# Patient Record
Sex: Female | Born: 1970 | Race: White | Hispanic: No | Smoking: Current every day smoker
Health system: Southern US, Community
[De-identification: ages and names within clinical notes are randomized; demographics above are authoritative.]

## PROBLEM LIST (undated history)

## (undated) DIAGNOSIS — R569 Unspecified convulsions: Secondary | ICD-10-CM

## (undated) DIAGNOSIS — B1920 Unspecified viral hepatitis C without hepatic coma: Secondary | ICD-10-CM

## (undated) HISTORY — PX: TONSILLECTOMY: SUR1361

## (undated) HISTORY — PX: ABDOMINAL SURGERY: SHX537

## (undated) HISTORY — PX: OVARIAN CYST SURGERY: SHX726

## (undated) HISTORY — PX: OOPHORECTOMY: SHX86

## (undated) HISTORY — PX: TUBAL LIGATION: SHX77

## (undated) HISTORY — PX: CHOLECYSTECTOMY: SHX55

---

## 2000-01-04 ENCOUNTER — Encounter: Payer: Self-pay | Admitting: Emergency Medicine

## 2000-01-04 ENCOUNTER — Emergency Department (HOSPITAL_COMMUNITY): Admission: EM | Admit: 2000-01-04 | Discharge: 2000-01-04 | Payer: Self-pay | Admitting: Emergency Medicine

## 2000-02-12 ENCOUNTER — Emergency Department (HOSPITAL_COMMUNITY): Admission: EM | Admit: 2000-02-12 | Discharge: 2000-02-13 | Payer: Self-pay | Admitting: Emergency Medicine

## 2000-07-17 ENCOUNTER — Encounter: Payer: Self-pay | Admitting: Internal Medicine

## 2000-07-17 ENCOUNTER — Inpatient Hospital Stay (HOSPITAL_COMMUNITY): Admission: EM | Admit: 2000-07-17 | Discharge: 2000-07-19 | Payer: Self-pay

## 2003-03-24 ENCOUNTER — Emergency Department (HOSPITAL_COMMUNITY): Admission: EM | Admit: 2003-03-24 | Discharge: 2003-03-25 | Payer: Self-pay | Admitting: Emergency Medicine

## 2003-10-02 ENCOUNTER — Other Ambulatory Visit: Payer: Self-pay

## 2003-11-09 ENCOUNTER — Other Ambulatory Visit: Payer: Self-pay

## 2004-01-05 ENCOUNTER — Inpatient Hospital Stay: Payer: Self-pay

## 2004-04-11 ENCOUNTER — Emergency Department: Payer: Self-pay | Admitting: Unknown Physician Specialty

## 2004-04-12 ENCOUNTER — Emergency Department: Payer: Self-pay | Admitting: Emergency Medicine

## 2004-05-13 ENCOUNTER — Emergency Department: Payer: Self-pay | Admitting: Emergency Medicine

## 2004-06-24 ENCOUNTER — Emergency Department: Payer: Self-pay | Admitting: Emergency Medicine

## 2004-07-06 ENCOUNTER — Inpatient Hospital Stay: Payer: Self-pay | Admitting: Psychiatry

## 2004-07-16 ENCOUNTER — Emergency Department: Payer: Self-pay | Admitting: Internal Medicine

## 2004-07-24 ENCOUNTER — Emergency Department: Payer: Self-pay | Admitting: Emergency Medicine

## 2004-07-25 ENCOUNTER — Emergency Department: Payer: Self-pay | Admitting: Emergency Medicine

## 2004-07-27 ENCOUNTER — Emergency Department: Payer: Self-pay | Admitting: Internal Medicine

## 2004-09-03 ENCOUNTER — Emergency Department: Payer: Self-pay | Admitting: Emergency Medicine

## 2004-10-08 ENCOUNTER — Other Ambulatory Visit: Payer: Self-pay

## 2004-10-08 ENCOUNTER — Inpatient Hospital Stay: Payer: Self-pay | Admitting: Unknown Physician Specialty

## 2004-12-05 ENCOUNTER — Emergency Department: Payer: Self-pay | Admitting: Emergency Medicine

## 2004-12-10 ENCOUNTER — Emergency Department: Payer: Self-pay | Admitting: Internal Medicine

## 2005-03-06 ENCOUNTER — Inpatient Hospital Stay: Payer: Self-pay | Admitting: Internal Medicine

## 2005-06-05 ENCOUNTER — Emergency Department: Payer: Self-pay | Admitting: Emergency Medicine

## 2005-07-31 ENCOUNTER — Inpatient Hospital Stay: Payer: Self-pay | Admitting: Psychiatry

## 2005-08-06 ENCOUNTER — Other Ambulatory Visit: Payer: Self-pay

## 2005-09-02 ENCOUNTER — Emergency Department (HOSPITAL_COMMUNITY): Admission: EM | Admit: 2005-09-02 | Discharge: 2005-09-02 | Payer: Self-pay | Admitting: Family Medicine

## 2005-09-20 ENCOUNTER — Emergency Department: Payer: Self-pay | Admitting: Emergency Medicine

## 2005-11-27 ENCOUNTER — Emergency Department: Payer: Self-pay | Admitting: Emergency Medicine

## 2006-03-17 ENCOUNTER — Emergency Department: Payer: Self-pay | Admitting: Emergency Medicine

## 2006-03-20 ENCOUNTER — Emergency Department: Payer: Self-pay | Admitting: General Practice

## 2006-07-19 IMAGING — CT CT ABD-PELV W/O CM
1 of 2 series · 15 of 32 positions shown, 19 images · non-contrast
Comparison: none

REASON FOR EXAM: (1) abd pain / stone protocol / [HOSPITAL]; (2) abd pain /
stone protocol / [HOSPITAL]
COMMENTS:

[Series 2: stone · axial · 0.66mm/px · z∈[-516,-148]mm · 15 of 140 slices shown, 19 images]
[im 11/140  soft-tissue]
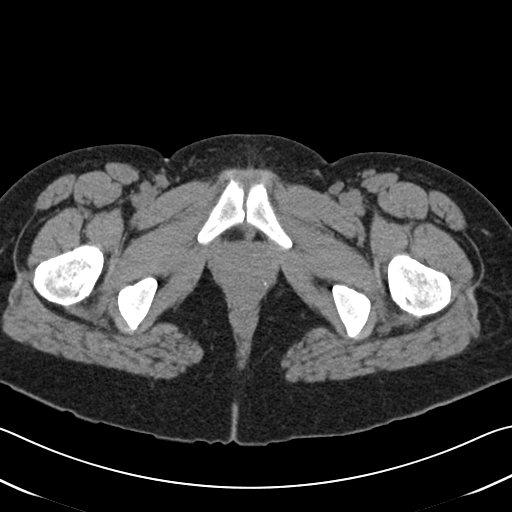
[im 11/140  bone]
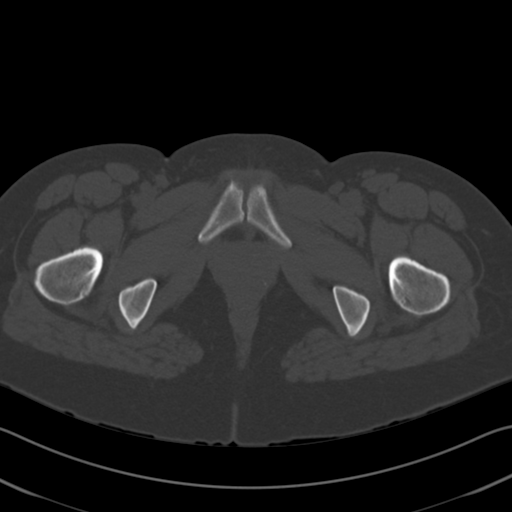
[im 21/140  soft-tissue]
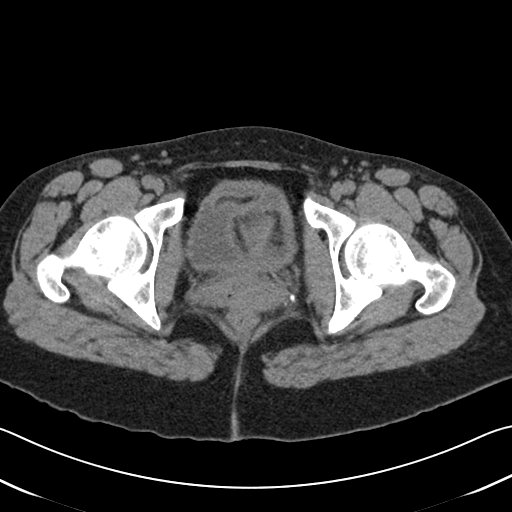
[im 31/140  soft-tissue]
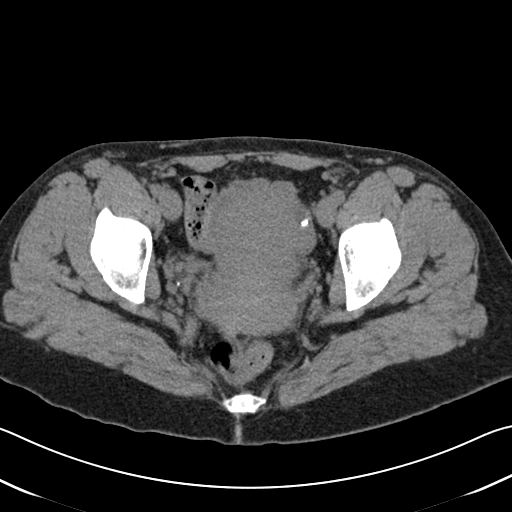
[im 42/140  soft-tissue]
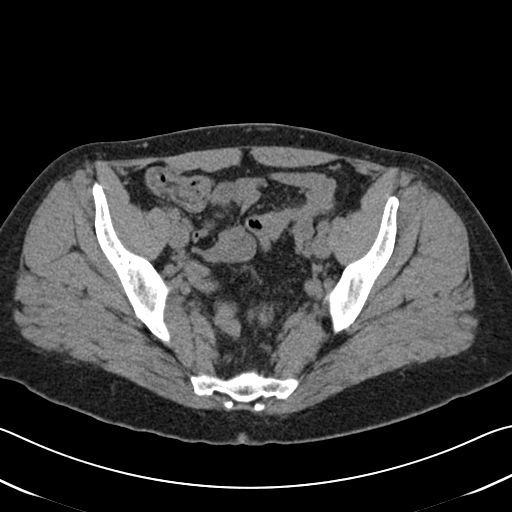
[im 52/140  soft-tissue]
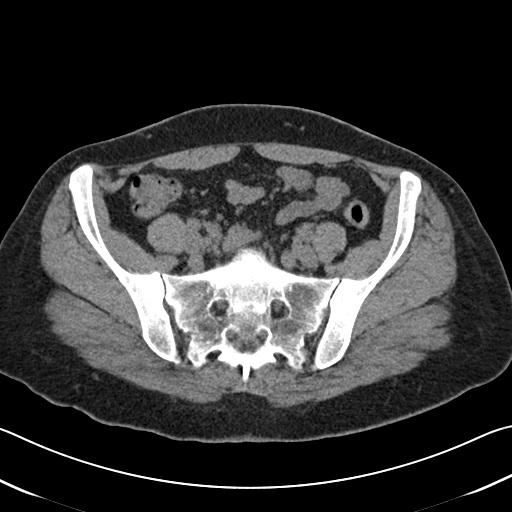
[im 62/140  soft-tissue]
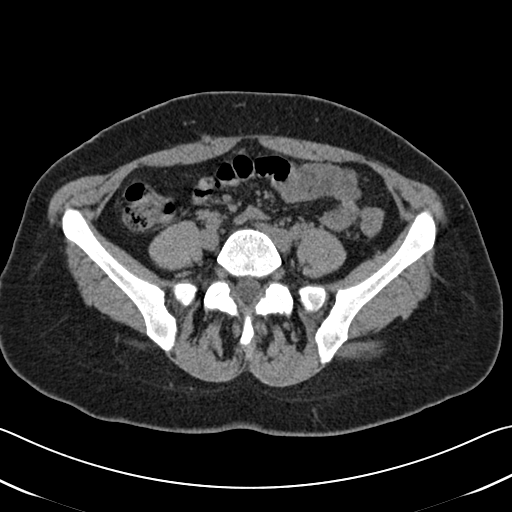
[im 73/140  soft-tissue]
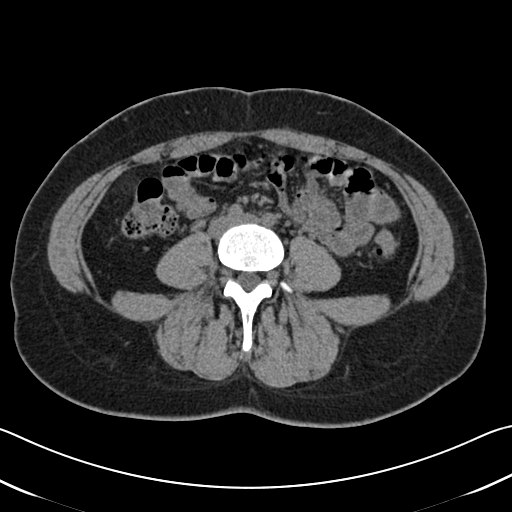
[im 83/140  soft-tissue]
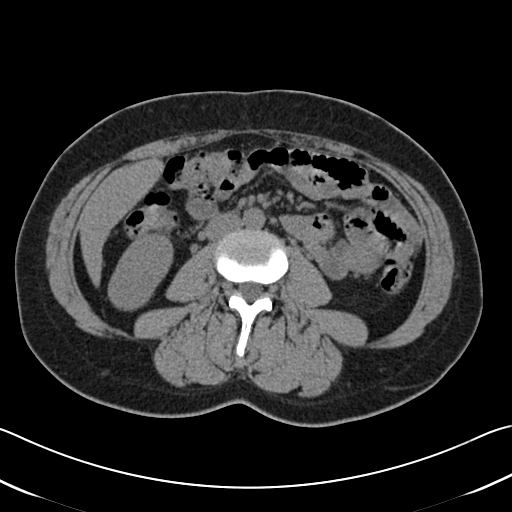
[im 93/140  soft-tissue]
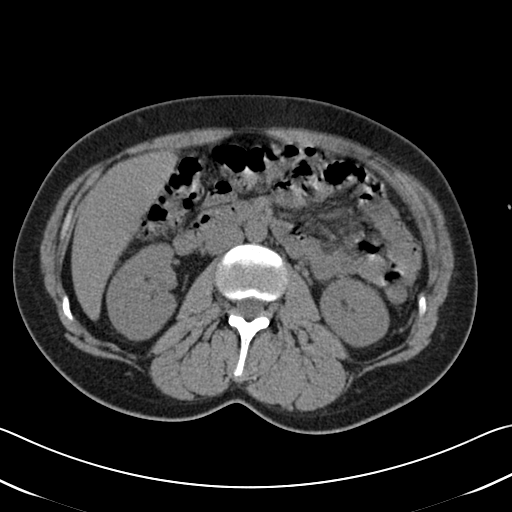
[im 93/140  bone]
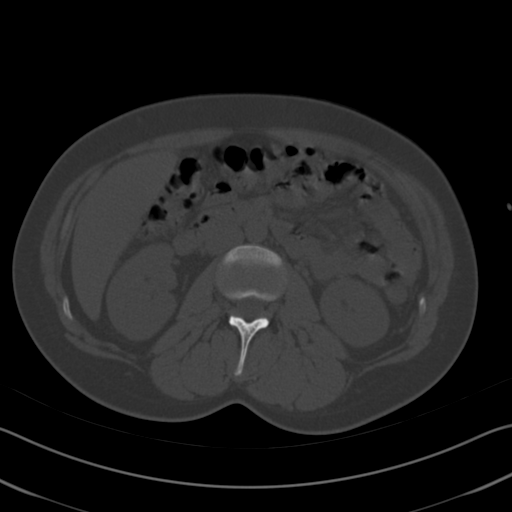
[im 103/140  soft-tissue]
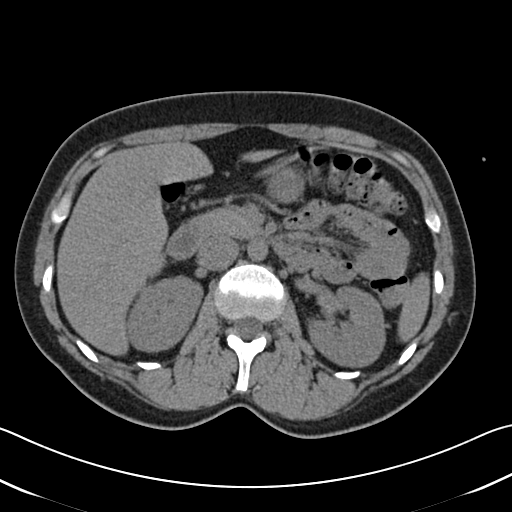
[im 114/140  soft-tissue]
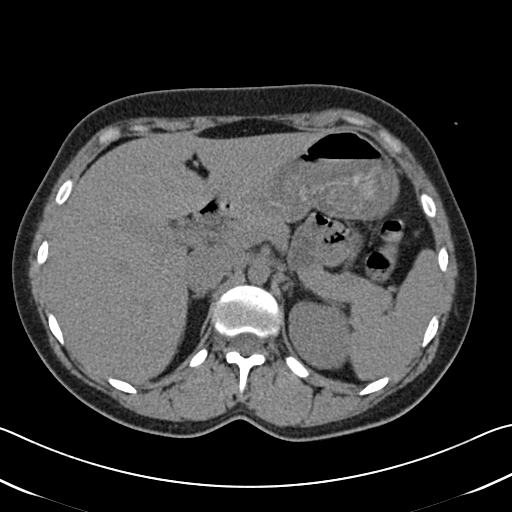
[im 119/140  lung]
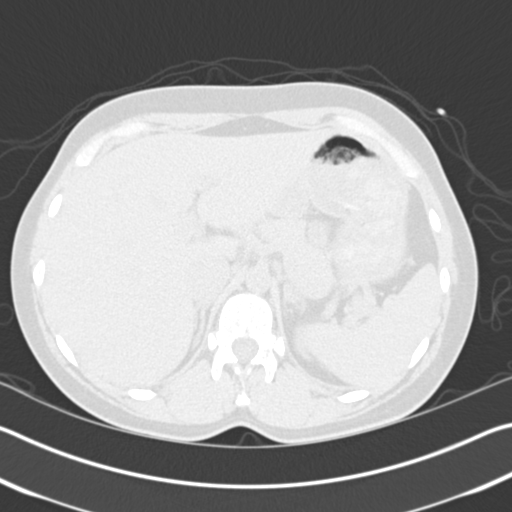
[im 124/140  soft-tissue]
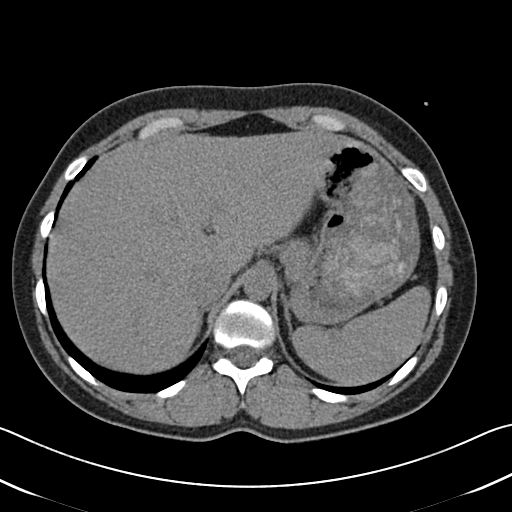
[im 124/140  lung]
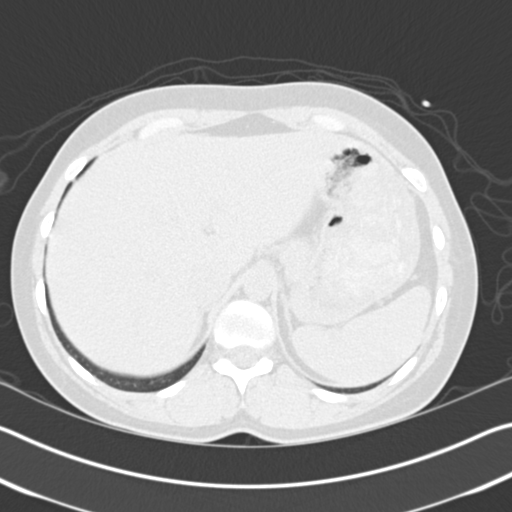
[im 129/140  lung]
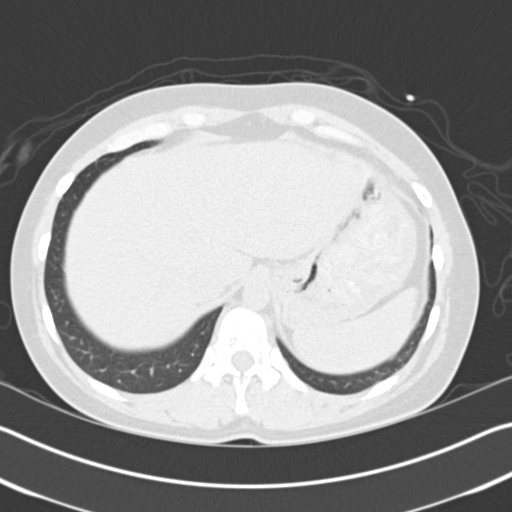
[im 134/140  soft-tissue]
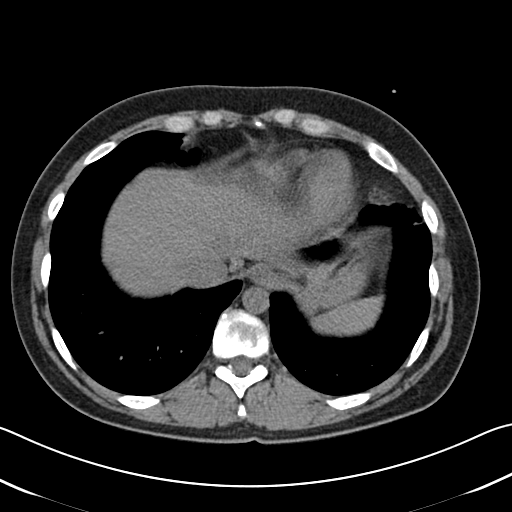
[im 134/140  lung]
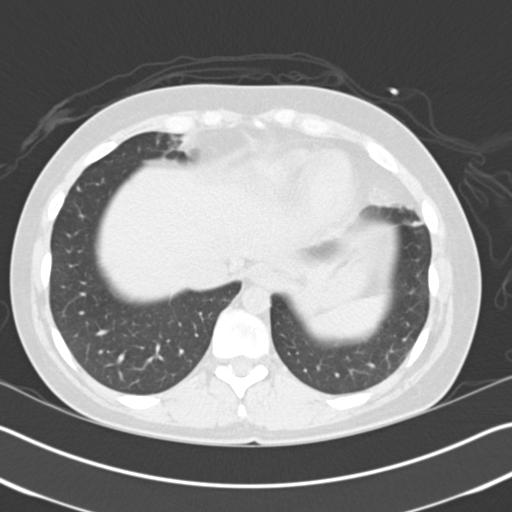

[15 of 32 positions shown; findings below may reference images not displayed]

PROCEDURE:     CT  - CT ABDOMEN AND PELVIS W[DATE] [DATE]

RESULT:       The liver and spleen are normal.  The pancreas is normal.
Surgical clips are noted in the gallbladder fossa.  There is no biliary
distention.  The adrenals are normal.  There is no hydronephrosis. Multiple
calcified pelvic densities are noted.  These are most consistent with
phleboliths.  Tiny sand-like distal ureteral stones that are nonobstructing
cannot be excluded. The abdominal aorta is normal.  There is no bowel
distention.  The uterus is enlarged.  A calcified uterine fibroid may be
present.  This could be further evaluated with pelvic ultrasound as
clinically indicated.
IMPRESSION: 1.     Uterine enlargement with possible calcified uterine fibroid.
2.     Multiple calcified pelvic densities most consistent with phleboliths.
 Nonobstructing distal ureteral stones cannot be excluded.
3.     The pericecal and periappendiceal region is unremarkable.

## 2006-08-15 ENCOUNTER — Inpatient Hospital Stay (HOSPITAL_COMMUNITY): Admission: AD | Admit: 2006-08-15 | Discharge: 2006-08-16 | Payer: Self-pay | Admitting: Obstetrics & Gynecology

## 2007-10-21 ENCOUNTER — Inpatient Hospital Stay (HOSPITAL_COMMUNITY): Admission: AD | Admit: 2007-10-21 | Discharge: 2007-10-26 | Payer: Self-pay | Admitting: Psychiatry

## 2007-10-21 ENCOUNTER — Ambulatory Visit: Payer: Self-pay | Admitting: Psychiatry

## 2007-10-24 ENCOUNTER — Encounter: Payer: Self-pay | Admitting: Psychiatry

## 2007-12-12 ENCOUNTER — Other Ambulatory Visit: Payer: Self-pay

## 2007-12-12 ENCOUNTER — Emergency Department: Payer: Self-pay | Admitting: Emergency Medicine

## 2007-12-20 ENCOUNTER — Emergency Department: Payer: Self-pay | Admitting: Emergency Medicine

## 2008-07-18 ENCOUNTER — Emergency Department (HOSPITAL_COMMUNITY): Admission: EM | Admit: 2008-07-18 | Discharge: 2008-07-18 | Payer: Self-pay | Admitting: Emergency Medicine

## 2008-07-30 ENCOUNTER — Inpatient Hospital Stay (HOSPITAL_COMMUNITY): Admission: AD | Admit: 2008-07-30 | Discharge: 2008-08-06 | Payer: Self-pay | Admitting: *Deleted

## 2008-07-30 ENCOUNTER — Ambulatory Visit: Payer: Self-pay | Admitting: *Deleted

## 2008-07-30 ENCOUNTER — Emergency Department (HOSPITAL_COMMUNITY): Admission: EM | Admit: 2008-07-30 | Discharge: 2008-07-30 | Payer: Self-pay | Admitting: Emergency Medicine

## 2008-08-09 ENCOUNTER — Emergency Department: Payer: Self-pay | Admitting: Emergency Medicine

## 2008-08-10 ENCOUNTER — Emergency Department: Payer: Self-pay | Admitting: Internal Medicine

## 2008-08-15 ENCOUNTER — Emergency Department: Payer: Self-pay | Admitting: Internal Medicine

## 2008-08-15 ENCOUNTER — Emergency Department: Payer: Self-pay | Admitting: Emergency Medicine

## 2008-08-16 ENCOUNTER — Emergency Department: Payer: Self-pay | Admitting: Emergency Medicine

## 2009-04-16 ENCOUNTER — Emergency Department (HOSPITAL_COMMUNITY): Admission: EM | Admit: 2009-04-16 | Discharge: 2009-04-16 | Payer: Self-pay | Admitting: Emergency Medicine

## 2009-05-24 ENCOUNTER — Emergency Department: Payer: Self-pay | Admitting: Emergency Medicine

## 2009-05-30 ENCOUNTER — Emergency Department: Payer: Self-pay | Admitting: Internal Medicine

## 2009-06-12 ENCOUNTER — Emergency Department: Payer: Self-pay | Admitting: Emergency Medicine

## 2009-08-23 ENCOUNTER — Ambulatory Visit: Payer: Self-pay | Admitting: Pain Medicine

## 2010-03-13 ENCOUNTER — Emergency Department: Payer: Self-pay | Admitting: Emergency Medicine

## 2010-03-26 ENCOUNTER — Ambulatory Visit: Payer: Medicare Other | Admitting: Internal Medicine

## 2010-04-08 ENCOUNTER — Emergency Department: Payer: Self-pay | Admitting: Emergency Medicine

## 2010-06-11 LAB — POCT PREGNANCY, URINE: Preg Test, Ur: NEGATIVE

## 2010-06-11 LAB — POCT I-STAT, CHEM 8
BUN: 9 mg/dL (ref 6–23)
Calcium, Ion: 1.11 mmol/L — ABNORMAL LOW (ref 1.12–1.32)
Chloride: 108 mEq/L (ref 96–112)
Creatinine, Ser: 0.7 mg/dL (ref 0.4–1.2)
Glucose, Bld: 99 mg/dL (ref 70–99)
HCT: 40 % (ref 36.0–46.0)
Hemoglobin: 13.6 g/dL (ref 12.0–15.0)
Potassium: 3.5 mEq/L (ref 3.5–5.1)
Sodium: 138 mEq/L (ref 135–145)
TCO2: 21 mmol/L (ref 0–100)

## 2010-06-11 LAB — DIFFERENTIAL
Basophils Absolute: 0.1 10*3/uL (ref 0.0–0.1)
Basophils Relative: 1 % (ref 0–1)
Eosinophils Absolute: 0.2 10*3/uL (ref 0.0–0.7)
Eosinophils Relative: 3 % (ref 0–5)
Lymphocytes Relative: 29 % (ref 12–46)
Lymphs Abs: 2.6 10*3/uL (ref 0.7–4.0)
Monocytes Absolute: 0.8 10*3/uL (ref 0.1–1.0)
Monocytes Relative: 9 % (ref 3–12)
Neutro Abs: 5.4 10*3/uL (ref 1.7–7.7)
Neutrophils Relative %: 59 % (ref 43–77)

## 2010-06-11 LAB — URINALYSIS, ROUTINE W REFLEX MICROSCOPIC
Bilirubin Urine: NEGATIVE
Glucose, UA: NEGATIVE mg/dL
Ketones, ur: NEGATIVE mg/dL
Nitrite: NEGATIVE
Protein, ur: NEGATIVE mg/dL
Specific Gravity, Urine: 1.009 (ref 1.005–1.030)
Urobilinogen, UA: 1 mg/dL (ref 0.0–1.0)
pH: 6.5 (ref 5.0–8.0)

## 2010-06-11 LAB — PHENYTOIN LEVEL, TOTAL: Phenytoin Lvl: <2.5 ug/mL — ABNORMAL LOW (ref 10.0–20.0)

## 2010-06-11 LAB — URINE MICROSCOPIC-ADD ON

## 2010-06-11 LAB — VALPROIC ACID LEVEL: Valproic Acid Lvl: 24.5 ug/mL — ABNORMAL LOW (ref 50.0–100.0)

## 2010-06-11 LAB — CBC
HCT: 37.4 % (ref 36.0–46.0)
Hemoglobin: 13.3 g/dL (ref 12.0–15.0)
MCHC: 35.5 g/dL (ref 30.0–36.0)
MCV: 84.8 fL (ref 78.0–100.0)
Platelets: 243 10*3/uL (ref 150–400)
RBC: 4.41 MIL/uL (ref 3.87–5.11)
RDW: 13.9 % (ref 11.5–15.5)
WBC: 9.2 10*3/uL (ref 4.0–10.5)

## 2010-07-04 LAB — URINALYSIS, ROUTINE W REFLEX MICROSCOPIC
Bilirubin Urine: NEGATIVE
Glucose, UA: NEGATIVE mg/dL
Hgb urine dipstick: NEGATIVE
Nitrite: NEGATIVE
Protein, ur: NEGATIVE mg/dL
Specific Gravity, Urine: 1.029 (ref 1.005–1.030)
Urobilinogen, UA: 1 mg/dL (ref 0.0–1.0)
pH: 5.5 (ref 5.0–8.0)

## 2010-07-04 LAB — BASIC METABOLIC PANEL
CO2: 24 mEq/L (ref 19–32)
Calcium: 8.7 mg/dL (ref 8.4–10.5)
GFR calc Af Amer: >60 mL/min (ref >60)
Potassium: 3.7 mEq/L (ref 3.5–5.1)
Sodium: 138 mEq/L (ref 135–145)

## 2010-07-04 LAB — DIFFERENTIAL
Basophils Relative: 3 % — ABNORMAL HIGH (ref 0–1)
Lymphocytes Relative: 19 % (ref 12–46)
Monocytes Absolute: 0.9 10*3/uL (ref 0.1–1.0)
Monocytes Relative: 6 % (ref 3–12)
Neutro Abs: 10.4 10*3/uL — ABNORMAL HIGH (ref 1.7–7.7)

## 2010-07-04 LAB — CBC
HCT: 35.8 % — ABNORMAL LOW (ref 36.0–46.0)
Hemoglobin: 13.5 g/dL (ref 12.0–15.0)
MCHC: 35.4 g/dL (ref 30.0–36.0)
MCHC: 34.1 g/dL (ref 30.0–36.0)
MCV: 84.8 fL (ref 78.0–100.0)
Platelets: 267 10*3/uL (ref 150–400)
RBC: 4.57 MIL/uL (ref 3.87–5.11)
RDW: 13 % (ref 11.5–15.5)

## 2010-07-04 LAB — HEPATIC FUNCTION PANEL
ALT: 24 U/L (ref 0–35)
AST: 27 U/L (ref 0–37)
Bilirubin, Direct: <0.1 mg/dL (ref 0.0–0.3)
Bilirubin, Direct: <0.1 mg/dL (ref 0.0–0.3)
Total Bilirubin: 0.3 mg/dL (ref 0.3–1.2)
Total Bilirubin: 0.4 mg/dL (ref 0.3–1.2)

## 2010-07-04 LAB — RAPID URINE DRUG SCREEN, HOSP PERFORMED
Amphetamines: NOT DETECTED
Barbiturates: NOT DETECTED
Benzodiazepines: NOT DETECTED
Cocaine: POSITIVE — AB
Opiates: NOT DETECTED
Tetrahydrocannabinol: NOT DETECTED

## 2010-07-04 LAB — URINE MICROSCOPIC-ADD ON

## 2010-07-04 LAB — PREGNANCY, URINE: Preg Test, Ur: NEGATIVE

## 2010-07-05 LAB — ETHANOL: Alcohol, Ethyl (B): <5 mg/dL (ref 0–10)

## 2010-07-05 LAB — POCT I-STAT, CHEM 8
BUN: 9 mg/dL (ref 6–23)
Chloride: 104 mEq/L (ref 96–112)
HCT: 40 % (ref 36.0–46.0)
Sodium: 138 mEq/L (ref 135–145)
TCO2: 22 mmol/L (ref 0–100)

## 2010-07-05 LAB — RAPID URINE DRUG SCREEN, HOSP PERFORMED: Barbiturates: NOT DETECTED

## 2010-07-05 LAB — POCT PREGNANCY, URINE: Preg Test, Ur: NEGATIVE

## 2010-08-08 NOTE — H&P (Signed)
Megan Shaffer, Megan Shaffer                 ACCOUNT NO.:  [id number]   MEDICAL RECORD NO.:  [id number]          PATIENT TYPE:  IPS   LOCATION:  0305                          [facility]ACILITY:  [facility]   PHYSICIAN:  Geoffery Lyons, M.D.      DATE OF BIRTH:  1970/07/23   DATE OF ADMISSION:  07/30/2008  DATE OF DISCHARGE:                       PSYCHIATRIC ADMISSION ASSESSMENT   TIME:  1330.   IDENTIFYING INFORMATION:  A 40 year old Caucasian female.  This is a  voluntary admission.   HISTORY OF PRESENT ILLNESS:  Second [facility] admission for this 40 year old  woman who initially presented by way of the emergency room requesting  detox and reporting suicidal thoughts as she has been using  heroin  heavily for the past several months and used 10 bags within the past 48  hours prior to presentation.  Says she has been using this amount  frequently for the last several months, very heavy use, has been  injecting it into her breasts.  Also has been out of her Xanax for the  past 4 days because someone stole it and was previously taking Xanax 2  mg t.i.d.  She was taking her Depakote, but none of her other  medications including the Paxil that she was previously on for  depression.  She also presented with a typical migraine headache that  she attributes to being agitated, upset and crying heavily.  Says she is  ashamed of her heroin use and the amount that she has been using.  She  endorses suicidal thoughts with thinking about possibly overdosing.   PAST PSYCHIATRIC HISTORY:  Second [facility] admission.  She was here  previously in July 2009 for a 5 day stay and at that time was detoxed  off of alcohol.  Has a history of a persistent use of alcohol and also  previous admissions at [facility] and [facility].  Started using heroin within the past year because she had a  boyfriend who got her started using it.  Has a history of alcohol  withdrawal seizures and has also endorsed a history of  epilepsy.   SOCIAL HISTORY:  Current living situation is not clear, although she  says she does have a safe place to go when she leaves here.  Has one 75-  year-old son and possibly other children.  Says that her children are  currently living with her ex-husband.  She denies current legal  problems.   MEDICAL HISTORY:  Primary care [facility] Rogstad is not clear.  Medical problems  are seizure disorder NOS, hepatitis C, current severe headache NOS.   CURRENT MEDICATIONS:  1. Paxil, not taking.  2. Alprazolam 2 mg t.i.d., none in the past 2-3 days.  3. Depakote 1000 mg daily.   DRUG ALLERGIES:  IMITREX, ZOFRAN, TORADOL which have caused angioedema  edema and CIPRO.   PHYSICAL EXAMINATION:  Physical exam was done in the emergency room.  It  was felt there that she had no significant neurologic signs associated  with a headache.  She was not given any clonidine there due to her blood  pressure being 100/60.  Pulse is stable.  She was treated with Ativan,  Benadryl and Reglan in the emergency room.   LABORATORY DATA:  Remarkable for urinalysis with WBCs 3-6 per high-  powered field, trace ketones, BMET  within normal limits.  Alcohol level  less than 5.  Urine pregnancy test negative.  Urine drug screen positive  for cocaine.  CBC remarkable for WBC 14.4, hemoglobin 13.5, hematocrit  38.2, platelets 351,000.  Valproate level was currently pending.   MENTAL STATUS EXAM:  This is a fully alert female who was initially very  tearful, sobbing, complaining of severe headache 10:10, not being helped  by any of the Tylenol.  Thought process logical and coherent.  Once we  began to talk about treating her headache, eye contact becomes good.  Sobbing resolves and she is more composed.  Mood depressed, somewhat  irritable.  She has endorsed being depressed and having some suicidal  thoughts.  Cognition fully intact.  No dangerous intent today.  No plan  to harm herself here.  No homicidal thought.   No evidence of psychosis.   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  AXIS II:  Deferred.  AXIS III:  Severe headache not otherwise specified typical, seizure  disorder, history of hepatitis C.  AXIS IV:  Severe issues with relationships in the social environment.  AXIS V:  Current 40.  Past year not known.   PLAN:  The plan is to voluntarily admit her for a safe detox.  She has a  history of seizure disorder.  We started her on a Librium taper 25 mg  q.i.d. for 3 days followed by t.i.d. for 3 days and tapering down.  We  will treat her headache with Dilaudid 4 mg IM now and 25 mg of Phenergan  and q.6 h. p.r.n. for severe headache for at least the first 24 hours to  try to resolve this.  Other comfort measures prescribed.  We will  continue Seroquel 100 mg q.h.s. and 25 mg q.4 h. p.r.n., Depakote 500 mg  now and 500 mg q.a.m. and q.h.s.      Margaret A. Scott, N.P.      Geoffery Lyons, M.D.  Electronically Signed    MAS/MEDQ  D:  07/30/2008  T:  07/30/2008  Job:  [id number]

## 2010-08-08 NOTE — Discharge Summary (Signed)
Megan Shaffer, Megan Shaffer                 ACCOUNT NO.:  [id number]   MEDICAL RECORD NO.:  [id number]          PATIENT TYPE:  IPS   LOCATION:  0305                          FACILITY:  [facility]   PHYSICIAN:  Jasmine Pang, M.D. DATE OF BIRTH:  May 23, 1970   DATE OF ADMISSION:  07/30/2008  DATE OF DISCHARGE:  08/05/2008                               DISCHARGE SUMMARY   IDENTIFYING INFORMATION:  This is a 40 year old single Caucasian female  who was admitted on a voluntary basis on Jul 30, 2008.   HISTORY OF PRESENT ILLNESS:  This is the second [facility] admission for this  is 40 year old woman who initially presented by way of the emergency  room.  She was requesting detox and reported suicidal thoughts.  She has  been using heroin heavily for the past several months and used 10 bags  within the past 48 hours prior to presentation.  She says she has been  using this amount frequently for the last several months, very heavy use  and has been injecting it into her breasts.  She has also been out of  her Xanax for the past 4 days because someone stole it.  She was  previously taking Xanax 2 mg t.i.d.  She was taking her Depakote, but  none of her medications including the Paxil that she was previously on  for depression.  She also presented with atypical migraine headache that  she attributes to being agitated, upset, and crying heavily.  She  endorses suicidal thoughts with thinking about possibly overdosing.  For  further admission information, see psychiatric admission assessment.  Upon admission, the patient was given the Axis I diagnosis of depressive  disorder, not otherwise specified, and benzodiazepine abuse and opiate  dependence.   PHYSICAL FINDINGS:  Physical exam was done in the emergency room.  It  was felt that she had no significant neurologic signs associated with  headache.  She was not given any clonidine there due to her blood  pressure being 100/60.   ADMISSION LABORATORIES:   Urinalysis was remarkable for WBCs 3-6 per high-  power field.  Trace ketones.  BMET was within normal limits.  Alcohol  level was less than 5.  Urine pregnancy test was negative.  Urine drug  screen was positive for cocaine.  CBC was remarkable for WBC of 14.4,  hemoglobin of 13.5, and hematocrit of 38.2.  An a.m. Depakote level was  low at 18.3 (50-100).  An RPR was nonreactive.  A hepatic profile was  remarkable for a decreased total protein of 5.5 and decreased albumin 3  of 3.   HOSPITAL COURSE:  Upon admission, the patient was restarted on trazodone  100 mg p.o. q.h.s. and Seroquel 50 mg p.o. q.6 h p.r.n. agitation and  100 mg p.o. q.h.s.  She was also started on Dilaudid IM now and q.6 h.  p.r.n. severe headache and Phenergan 25 mg now and q.4 h. p.r.n. severe  headache to give with Dilaudid, and Depakote 500 mg now and 500 mg  q.a.m. q.h.s.  She was also started on Librium detox protocol  and a 21  mg nicotine patch.  In addition, she was started on Ambien 10 mg p.o.  q.h.s. p.r.n.  On Aug 01, 2007, Dilaudid was decreased to 2 mg every 6  hours p.r.n. pain.  She has a fall and required an x-ray, which revealed  no fracture.  In individual sessions, the patient was depressed and  anxious.  She was disheveled with fair eye contact.  There was a  significant amount of restlessness.  Mood was dysphoric.  She remained  focused on her headache pain.  She was endorsing passive suicidal  ideation.  She states she wants to go to a halfway house from here.  On  Aug 02, 2008, mental status was still depressed and anxious.  She  remained focused on her headache and wanted her Dilaudid increased.  On  Aug 04, 2008, Dilaudid was increased to 4 mg p.o. q.6 h. p.r.n. pain.  On Aug 04, 2008, mood was improving.  Her headache pain had decreased.  She still states she wants to go to an [address], but realizes she  may need to go home to live with her aunt given that she is on pain  medication and  they will not allow her to be on this at the [facility].  On Aug 05, 2008, mental status had improved markedly from  admission status.  The patient was less depressed and less anxious.  Affect was consistent with mood.  There was no suicidal or homicidal  ideation.  No thoughts of self curious behavior, no auditory or visual  hallucinations.  No paranoia or delusions.  Thoughts were logical and  goal-directed.  Thought content no predominant theme.  Cognitive was  grossly intact.  Insight fair.  Judgment fair.  Impulse control good.  It was felt the patient was safe for discharge today.  Hepatic function  profile was remarkable for slightly elevated SGOT of 39 (0-37) and a  slightly elevated SGPT of 41 (0-35), total protein was still low at 5.8  and albumin was low at 2.8.  CBC was remarkable for slightly decreased  hematocrit of 35.5 (36-46), otherwise normal.  On a.m., Depakote level  is pending.  It was felt the patient was safe for discharge.  Her aunt  was going to come to pick her up today and she was going to go home to  live with her aunt.   DISCHARGE DIAGNOSES:  Axis I:  Mood disorder, not otherwise specified  and polysubstance abuse.  Axis II:  None.  Axis III:  Severe headache, not otherwise specified; seizure disorder;  and history hepatitis C.  Axis IV:  Severe (issues with relationship and social environment,  burden of psychiatric illness, burden of chemical dependence, and burden  of medical problems).   DISCHARGE PLAN:  There was no specific activity level or dietary  restrictions.   POST HOSPITAL CARE PLANS:  The patient will see Dr. Omelia Blackwater on Aug 11, 2008, at 9 a.m.   DISCHARGE MEDICATIONS:  1. Depakote tablets 500 mg in the morning and at bedtime.  2. Seroquel 100 mg at bedtime.  3. Ambien 10 mg at bedtime as needed for sleep.  4. She will also complete her Librium detox by taking Librium 25 mg      twice daily through Aug 07, 2008, then Librium 25 mg once  daily      through Aug 10, 2008.  5. Paxil was discontinued due to concern that it was destabilizing her  mood.  6. Xanax was discontinued due to her polysubstance abuse.      Jasmine Pang, M.D.  Electronically Signed     BHS/MEDQ  D:  08/05/2008  T:  08/06/2008  Job:  [id number]

## 2010-08-11 NOTE — Consult Note (Signed)
[facility]  Patient:    Megan Shaffer, Megan Shaffer                        MRN: [id number] Proc. Date: 07/16/00 Adm. Date:  [id number] Attending:  Dorena Cookey                          Consultation Report  DATE OF BIRTH:  15-Mar-1971  REFERRING PHYSICIAN:  [facility].  REASON FOR EVALUATION:  Headache, possible meningitis.  HISTORY OF PRESENT ILLNESS:  See initial emergency room/inpatient consultation evaluation of this 40 year old woman with a past medical history of bipolar disorder, who presents with a four day history of the worse headache of her life.  The patient says she woke up around 4:40 in the morning four days ago with a severe global headache which was constant and not associated with any clear aggravating or relieving factors.  It really has not changed very much since that time.  There was some radiation down into the neck, and she does have some pain in the neck with neck movement but only minimal stiffness.  She has had several other symptoms over the last few days too, including body aches, subjective chills, nausea and vomiting, and some vague numbness and weakness of the right side.  She was seen in the [place] ER two days ago and underwent CT of the head which was interpreted as normal and underwent LP with results 3 white blood cells, normal protein, normal glucose, and negative Gram stain and culture.  She was discharged and came to the [facility] Room when the headache not resolved.  She states that the day before the headache began, she went fishing and was bitten by numerous mosquitoes.  She also may have received another bite on her heel.  In addition, she had a tooth extracted a couple of days prior to the onset of the headache.  PAST MEDICAL HISTORY: 1. Bipolar disorder. 2. Cholecystectomy. 3. Tubal pregnancy.  FAMILY HISTORY:  Noncontributory.  SOCIAL HISTORY:  She smokes about 1/2 pack-per-day  and occasionally consumes alcohol.  ALLERGIES:  No known drug allergies.  MEDICATIONS:  Depakote, Lithium, Seroquel.  REVIEW OF SYSTEMS:  Headache, as above.  She has had fever, chills, muscle aches, and a sore throat as well as some nausea and vomiting, as above.  There has been no chest pain, cough, abdominal pain, urinary symptoms, or rash.  She complains of diffuse joint pain.  PHYSICAL EXAMINATION:  VITAL SIGNS:  Temperature 100.1, blood pressure 142/78, pulse 118, respirations 20.  GENERAL/MENTAL STATUS:  She is awake, alert, and oriented, but in considerable distress secondary to her headache.  HEAD:  Cranium was normocephalic, atraumatic.  Oropharynx is benign.  NECK:  Slight decrease in range of motion, especially laterally, but there is no clear meningismus.  CHEST:  Clear to auscultation.  HEART:  Regular rate and rhythm without murmurs.  EXTREMITIES:  No edema.  SKIN:  No rashes are seen.  She does have some mosquito bites, and she also has two reddened punctate lesions on the left medial heel which looks like they may represent a snake bite.  NEUROLOGIC/CRANIAL NERVES:  Funduscopic exam is benign.  Pupils are equal and briskly reactive.  Extraocular movements are normal without nystagmus.  Visual fields are full to confrontation.  Face, tongue, and palate all move normally in the midline.  MOTOR EXAM:  Normal  bulk and tone.  There is giveaway weakness on the right in all muscles which is nonphysiologic.  SENSATION:  Intact to light touch in all extremities.  Finger-to-nose is performed well.  Reflexes are 2+.  Toes are downgoing.  LABORATORY EXAMINATION:  CT of the head is personally reviewed and is normal. LP was performed under fluoroscopic guidance and returned no white blood cells, no red blood cells, protein 44, glucose 96, and negative Gram stain. CBC with white count 21.5, hemoglobin 12.8, platelets 312,000.  CMET is normal.  Urinalysis is  negative.  Chest x-ray is negative.  IMPRESSION: 1. Headache, fever, leukocytosis without evidence of meningitis or other    infection.  Possibilities given her history, include a serum sickness    reaction, possibly invenomation.  Perhaps a rheumatologic problem. 2. Right-sided weakness which appears to be nonphysiologic. 3. History of bipolar disorder.  RECOMMENDATIONS: 1. Admit for pain control. 2. Will check MRI if focus complaints continue. 3. Consider steroids. DD:  07/16/00 TD:  07/15/00 Job: [id number]

## 2010-08-11 NOTE — Discharge Summary (Signed)
[facility]  Patient:    Megan Shaffer                        MRN: [id number] Adm. Date:  07/16/00 Disc. Date: 07/19/00 Attending:  Gordy Savers, M.D. [facility]                           Discharge Summary  FINAL DIAGNOSIS:  Acute viral syndrome with headache, fever and leukocytosis.  ADDITIONAL DIAGNOSES: 1. Bipolar disorder. 2. Remote cholecystectomy.  HISTORY OF PRESENT ILLNESS:  The patient is a 40 year old white female with a history of bipolar disorder who presented with a four day history of severe headaches.  There is some radiation to the neck area and some discomfort wit neck movement.  She also has several other complaints including body aches, subjective chills, nausea and some vague numbness involving her right side. She was initially evaluated at [facility] two days prior to admission and underwent a CT scan of the head, it was normal as was a lumbar puncture.  She was discharged, but presented to [facility] Room complaining of worsening headaches.  Prior to these headaches while outdoors fishing she had a number of bites by mosquitoes and in addition she had a tooth extraction several days prior to the onset of her headache.  The patient was subsequently admitted for further evaluation, fever and her headache and to exclude meningitis.  LABORATORY DATA AND HOSPITAL COURSE:  The patient was admitted to the hospital and observed.  The patient initially had a significant leukocytosis with a blood count of 28,000.  Monospot was negative.  Urinalysis negative.  The patient underwent CSF analysis and gram stain revealed no organisms.  Protein was normal at 44 and glucose slightly elevated at 96.  There were no significant cells.  Lithium level was slightly subtherapeutic at 0.36, valproic acid level was also low at 8.  Toxicology screening was positive for opioids.  White count progressively decreased during the  hospital admission and normalized prior to release.  Chemistries were normal.  The hospital course was marked by a slow but steady improvement.  On the second hospital day she had some significant abdominal pain and right lower quadrant tenderness and abdominal and pelvic CT scan was obtained that was unremarkable.  On the left decubitus view, the appendix filled with contrast and appeared normal.  DISPOSITION:  The patient will be discharged to resume her preadmission medications.  These will include the following: 1. Seroquel 100 mg in the morning, 200 mg at bedtime. 2. Paxil 20 mg daily. 3. Depakote extended release 500 mg daily. 4. Lithium carbonate 450 mg b.i.d.  CONDITION ON DISCHARGE:  Improved.  The patient returned to the care of her primary care physician. DD:  07/19/00 TD:  07/20/00 Job: [id number]
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
## 2010-08-25 IMAGING — CR DG ANKLE COMPLETE 3+V*R*
3 series · 3 of 3 positions shown · non-contrast
Comparison: None.

CLINICAL DATA: Ankle injury.

RIGHT ANKLE - COMPLETE 3+ VIEW

[t ankle joint ap right]
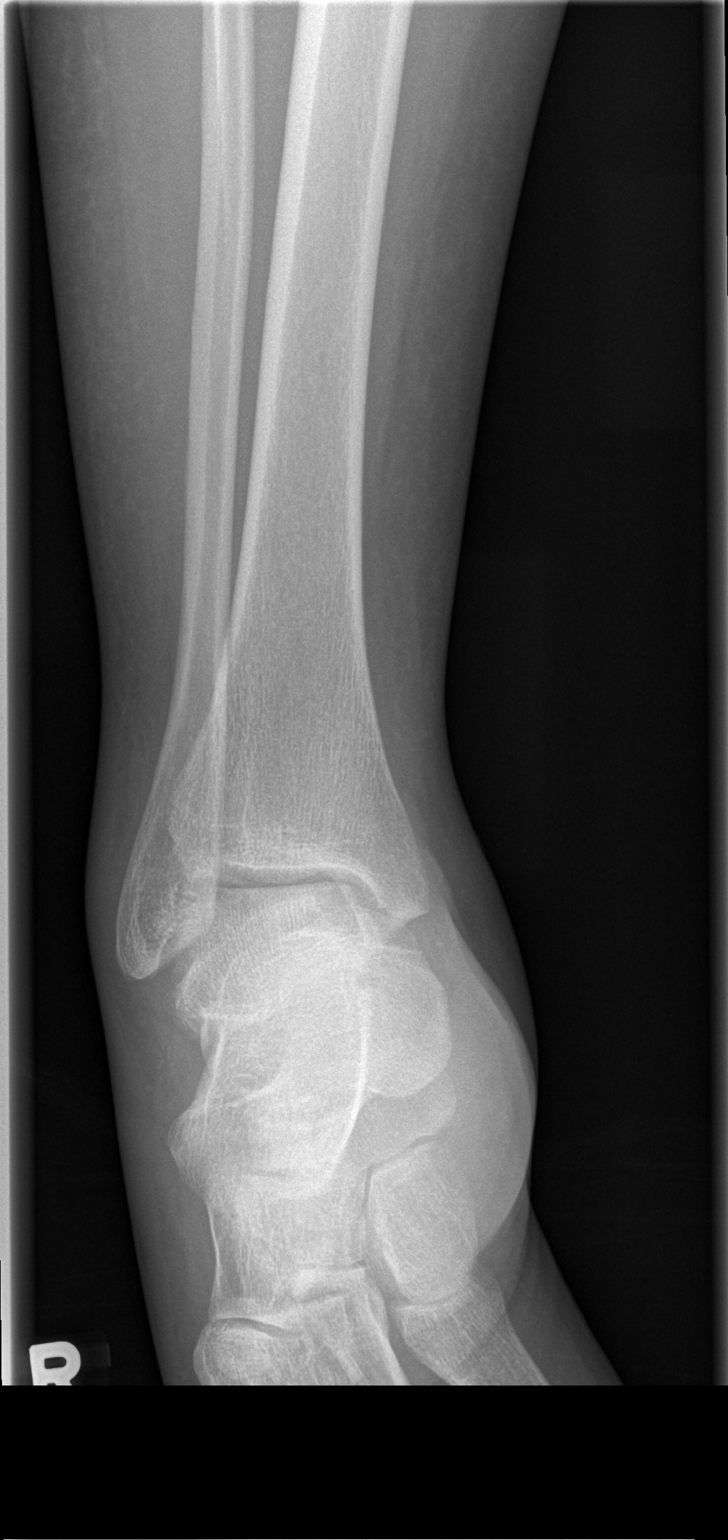

[t ankle joint oblique right]
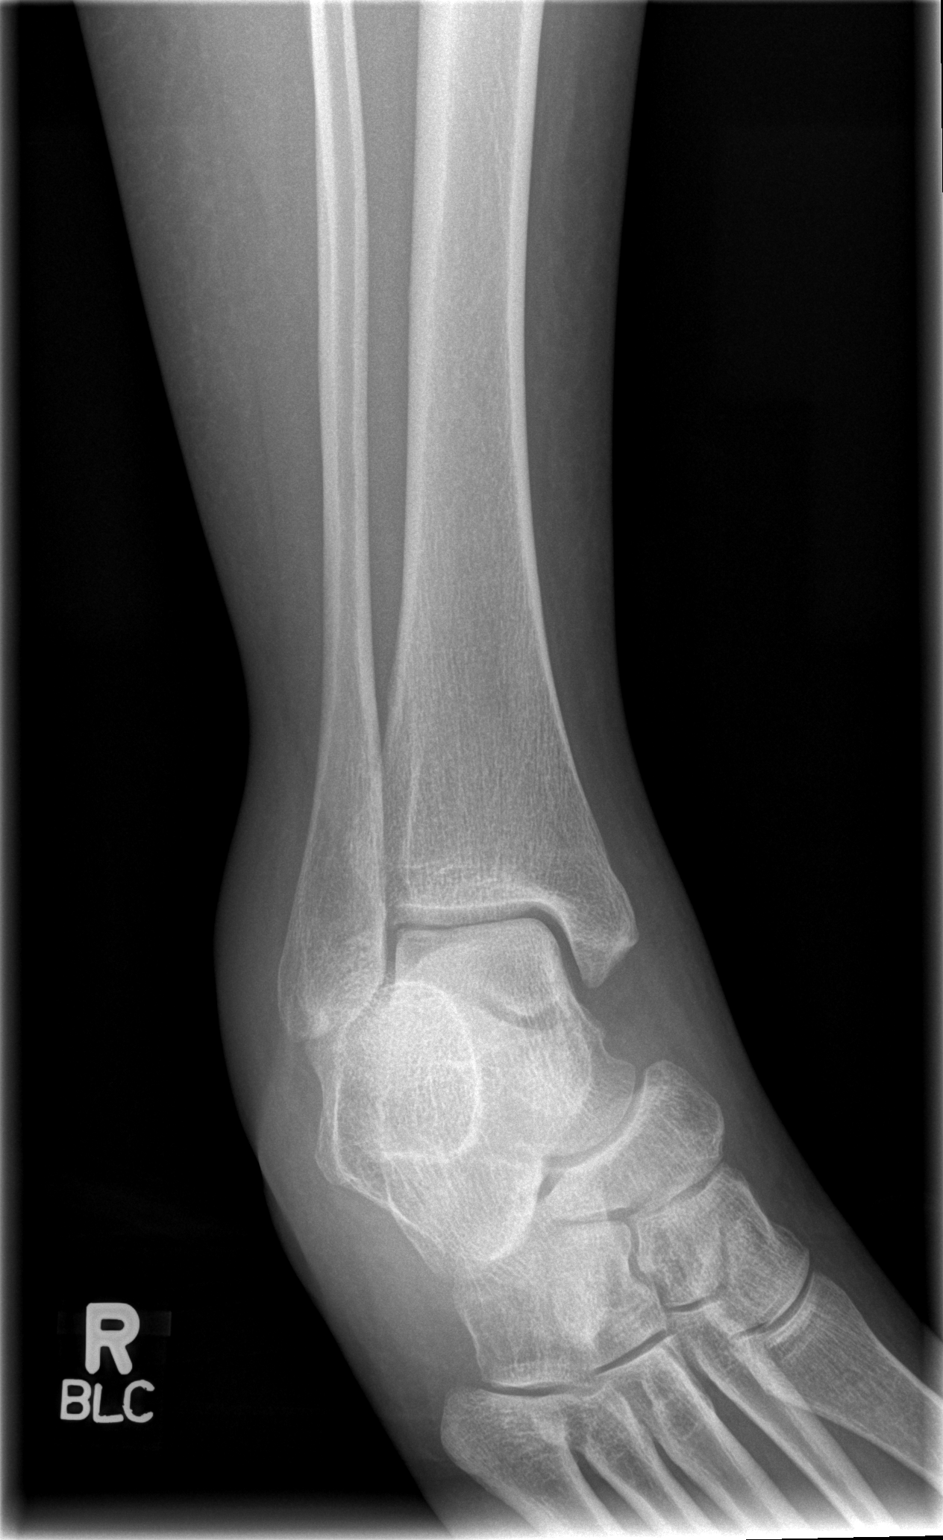

[t ankle joint lat right]
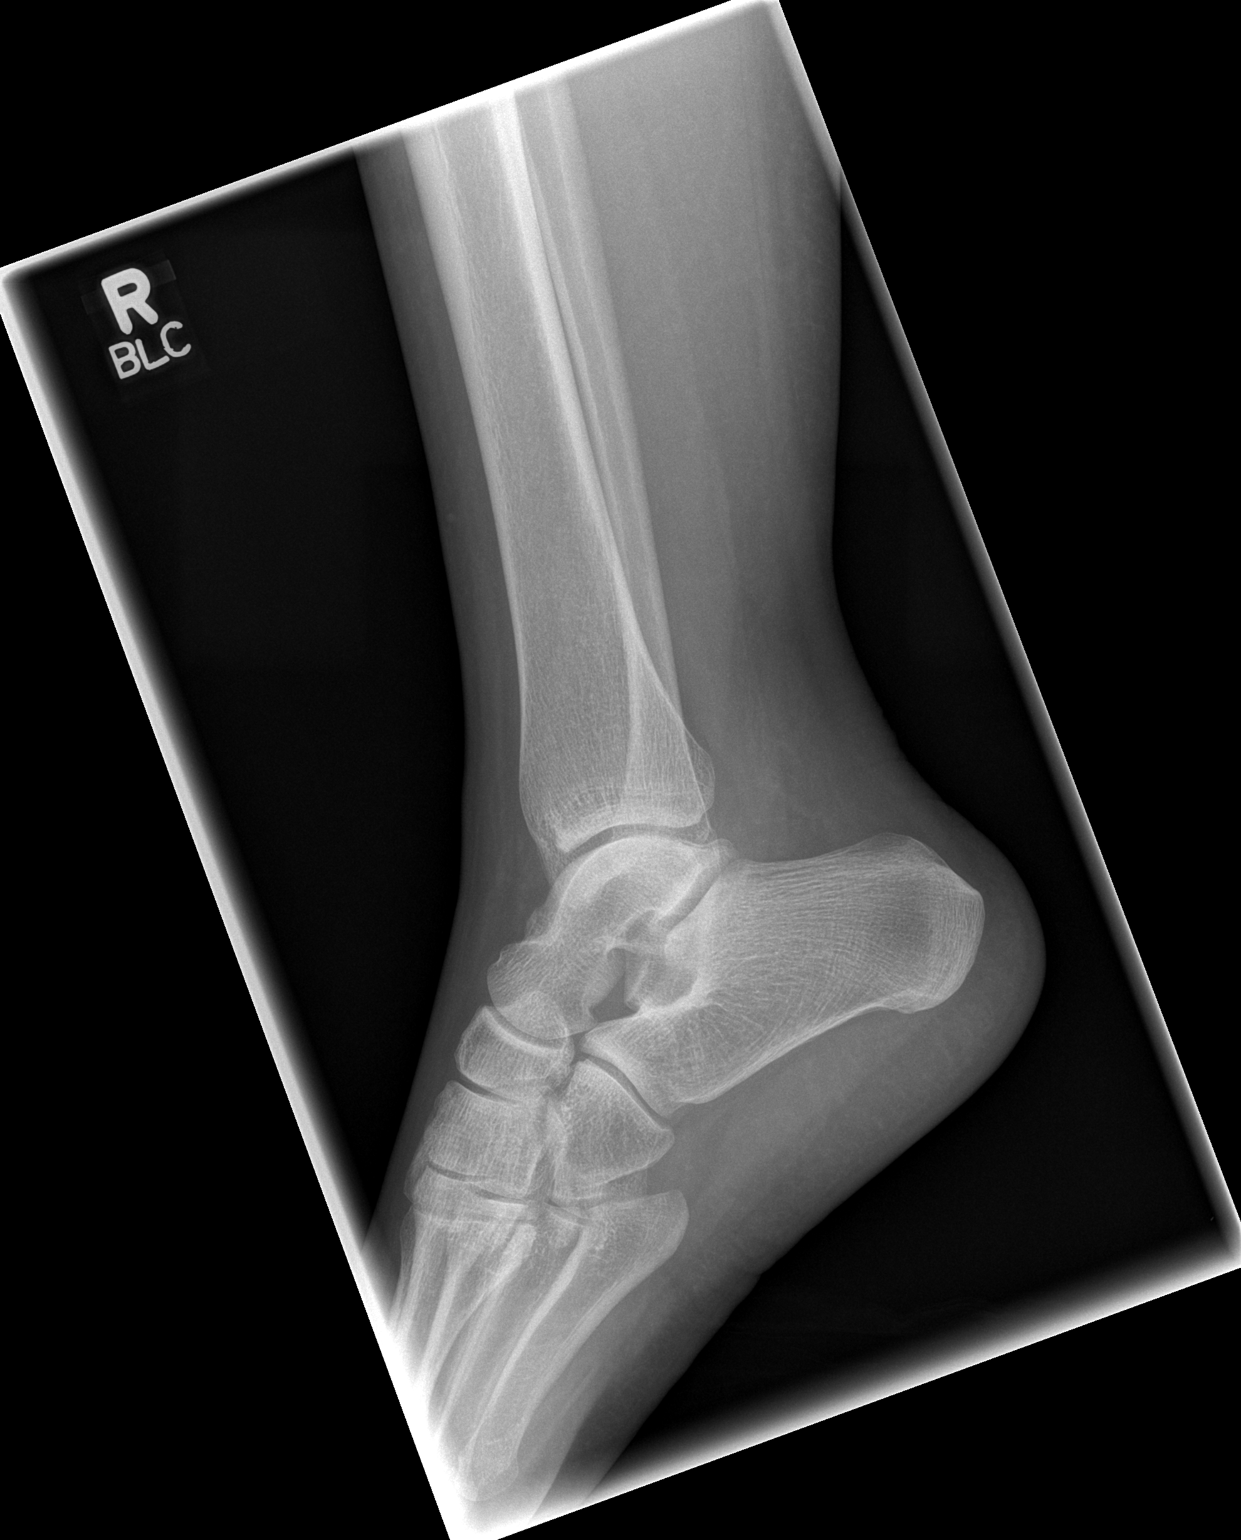

[3 of 3 positions shown; findings below may reference images not displayed]

FINDINGS: Negative for fracture or dislocation.  The ankle joint
appears normal there is no effusion.
IMPRESSION: Negative

## 2010-10-06 ENCOUNTER — Emergency Department: Payer: Self-pay | Admitting: Unknown Physician Specialty

## 2010-10-10 ENCOUNTER — Ambulatory Visit: Payer: Self-pay | Admitting: Pain Medicine

## 2010-11-28 ENCOUNTER — Emergency Department: Payer: Self-pay | Admitting: Emergency Medicine

## 2010-12-22 LAB — COMPREHENSIVE METABOLIC PANEL
ALT: 115 — ABNORMAL HIGH
AST: 108 — ABNORMAL HIGH
Alkaline Phosphatase: 68
CO2: 27
Chloride: 104
GFR calc Af Amer: >60
GFR calc non Af Amer: >60
Glucose, Bld: 186 — ABNORMAL HIGH
Potassium: 4.3
Sodium: 140
Total Bilirubin: 0.8

## 2010-12-22 LAB — CBC
HCT: 40.7
Hemoglobin: 13.3
MCHC: 33.5
MCV: 87.4
RBC: 4.66
RBC: 4.49
WBC: 6.3
WBC: 5.5

## 2010-12-22 LAB — DIFFERENTIAL
Eosinophils Absolute: 0.3
Lymphocytes Relative: 39
Lymphs Abs: 2.5
Monocytes Relative: 10
Neutrophils Relative %: 45

## 2010-12-22 LAB — PHENYTOIN LEVEL, TOTAL: Phenytoin Lvl: 5.8 — ABNORMAL LOW

## 2010-12-22 LAB — TSH: TSH: 3.291

## 2010-12-22 LAB — MAGNESIUM: Magnesium: 2.5

## 2011-04-14 ENCOUNTER — Emergency Department: Payer: Self-pay | Admitting: Emergency Medicine

## 2011-04-14 LAB — URINALYSIS, COMPLETE
Bacteria: NONE SEEN
Glucose,UR: NEGATIVE mg/dL (ref 0–75)
Nitrite: NEGATIVE
RBC,UR: 2 /HPF (ref 0–5)
WBC UR: 2 /HPF (ref 0–5)

## 2011-04-14 LAB — COMPREHENSIVE METABOLIC PANEL
Albumin: 3.7 g/dL (ref 3.4–5.0)
Anion Gap: 11 (ref 7–16)
Calcium, Total: 9.5 mg/dL (ref 8.5–10.1)
Chloride: 107 mmol/L (ref 98–107)
Co2: 22 mmol/L (ref 21–32)
EGFR (African American): >60
Glucose: 83 mg/dL (ref 65–99)
Osmolality: 276 (ref 275–301)
Potassium: 4.8 mmol/L (ref 3.5–5.1)
SGOT(AST): 26 U/L (ref 15–37)
Sodium: 140 mmol/L (ref 136–145)

## 2011-04-14 LAB — CBC
HGB: 14.7 g/dL (ref 12.0–16.0)
MCHC: 34.1 g/dL (ref 32.0–36.0)
RBC: 5.19 10*6/uL (ref 3.80–5.20)
WBC: 11.8 10*3/uL — ABNORMAL HIGH (ref 3.6–11.0)

## 2011-04-14 LAB — DRUG SCREEN, URINE
Amphetamines, Ur Screen: NEGATIVE (ref ?–1000)
Benzodiazepine, Ur Scrn: NEGATIVE (ref ?–200)
Cannabinoid 50 Ng, Ur ~~LOC~~: NEGATIVE (ref ?–50)
MDMA (Ecstasy)Ur Screen: NEGATIVE (ref ?–500)
Tricyclic, Ur Screen: NEGATIVE (ref ?–1000)

## 2011-04-14 LAB — PREGNANCY, URINE: Pregnancy Test, Urine: NEGATIVE m[IU]/mL

## 2011-04-14 LAB — LIPASE, BLOOD: Lipase: 161 U/L (ref 73–393)

## 2011-04-15 ENCOUNTER — Emergency Department: Payer: Self-pay | Admitting: Emergency Medicine

## 2011-12-01 ENCOUNTER — Emergency Department: Payer: Self-pay | Admitting: Emergency Medicine

## 2012-04-06 IMAGING — CR DG CHEST 2V
1 series · 2 of 2 positions shown · non-contrast
Comparison: none

REASON FOR EXAM: back and sternal pain
COMMENTS:   May transport without cardiac monitor

PROCEDURE:     DXR - DXR CHEST PA (OR AP) AND LATERAL  - March 13, 2010  [DATE]
RESULT:     Comparison: None there
Lungs:
The heart and mediastinum are within normal limits. The lungs are clear. No
pneumothorax.

[Series 1: view not recorded · 0.17mm/px · 2 of 2 slices shown]
[im 1/2]
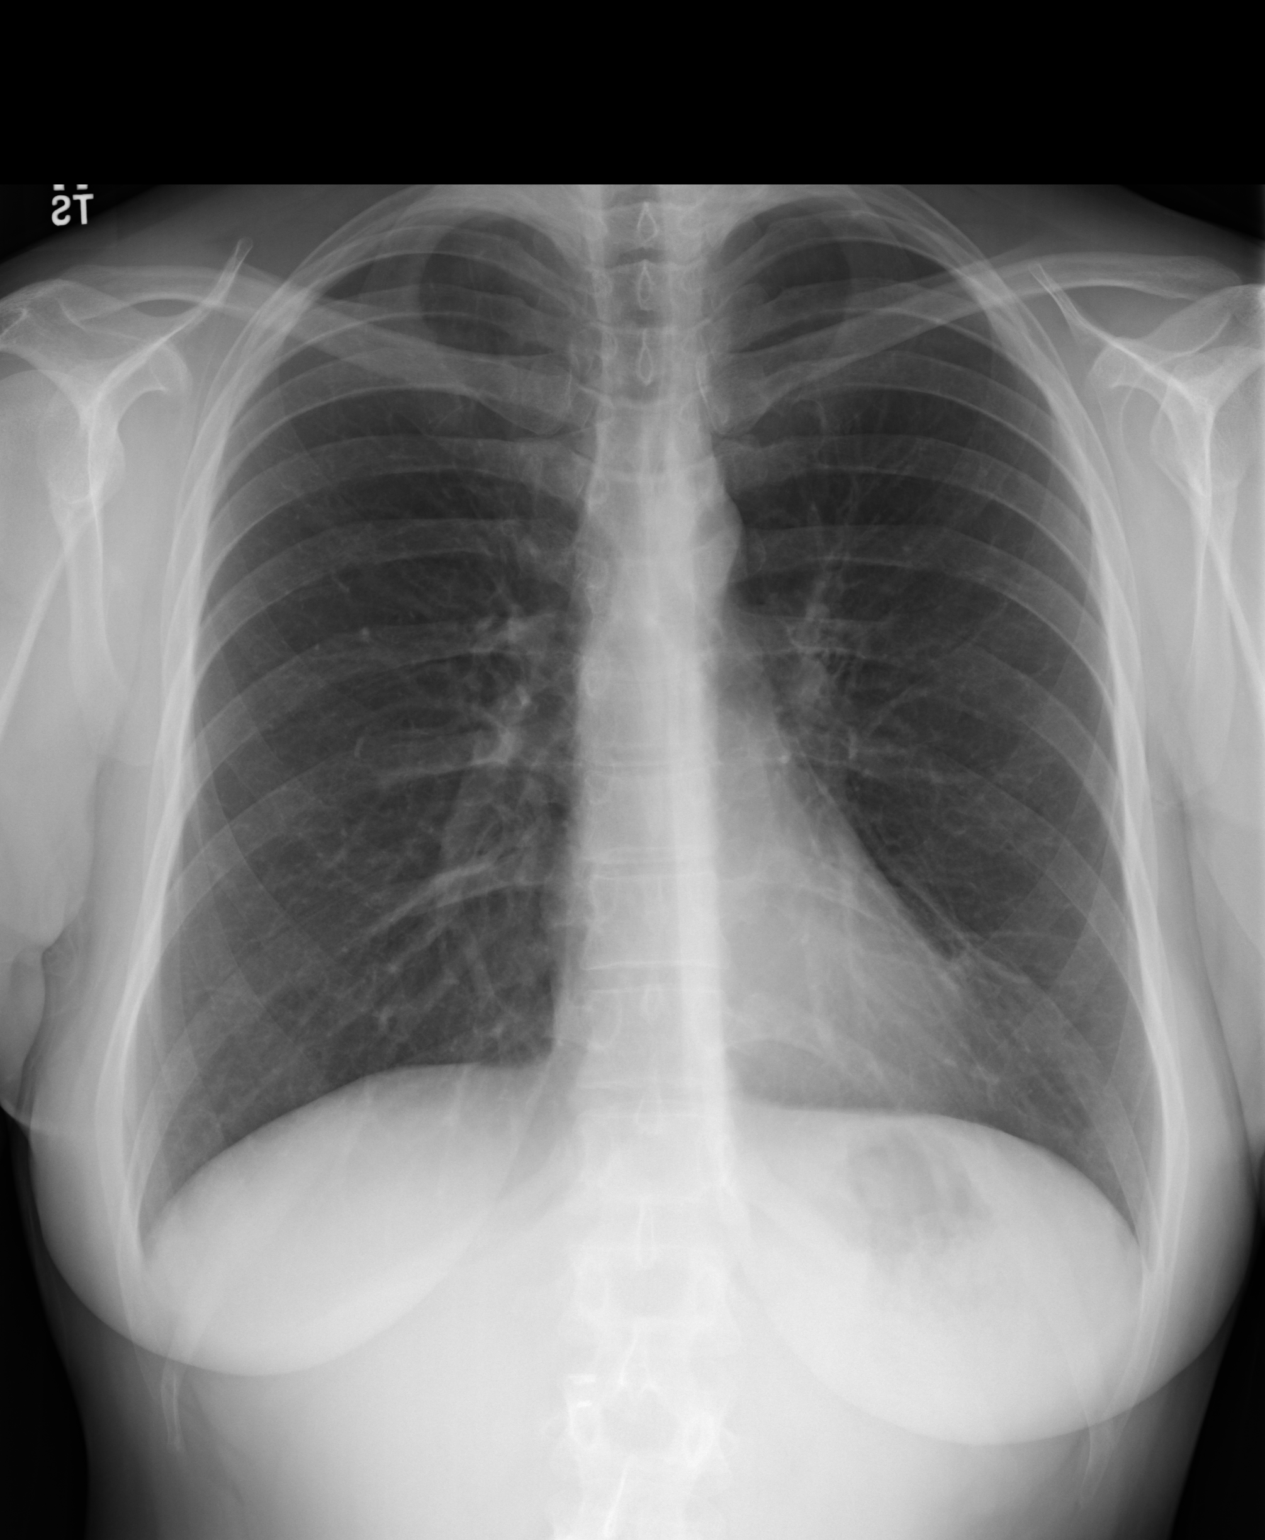
[im 2/2]
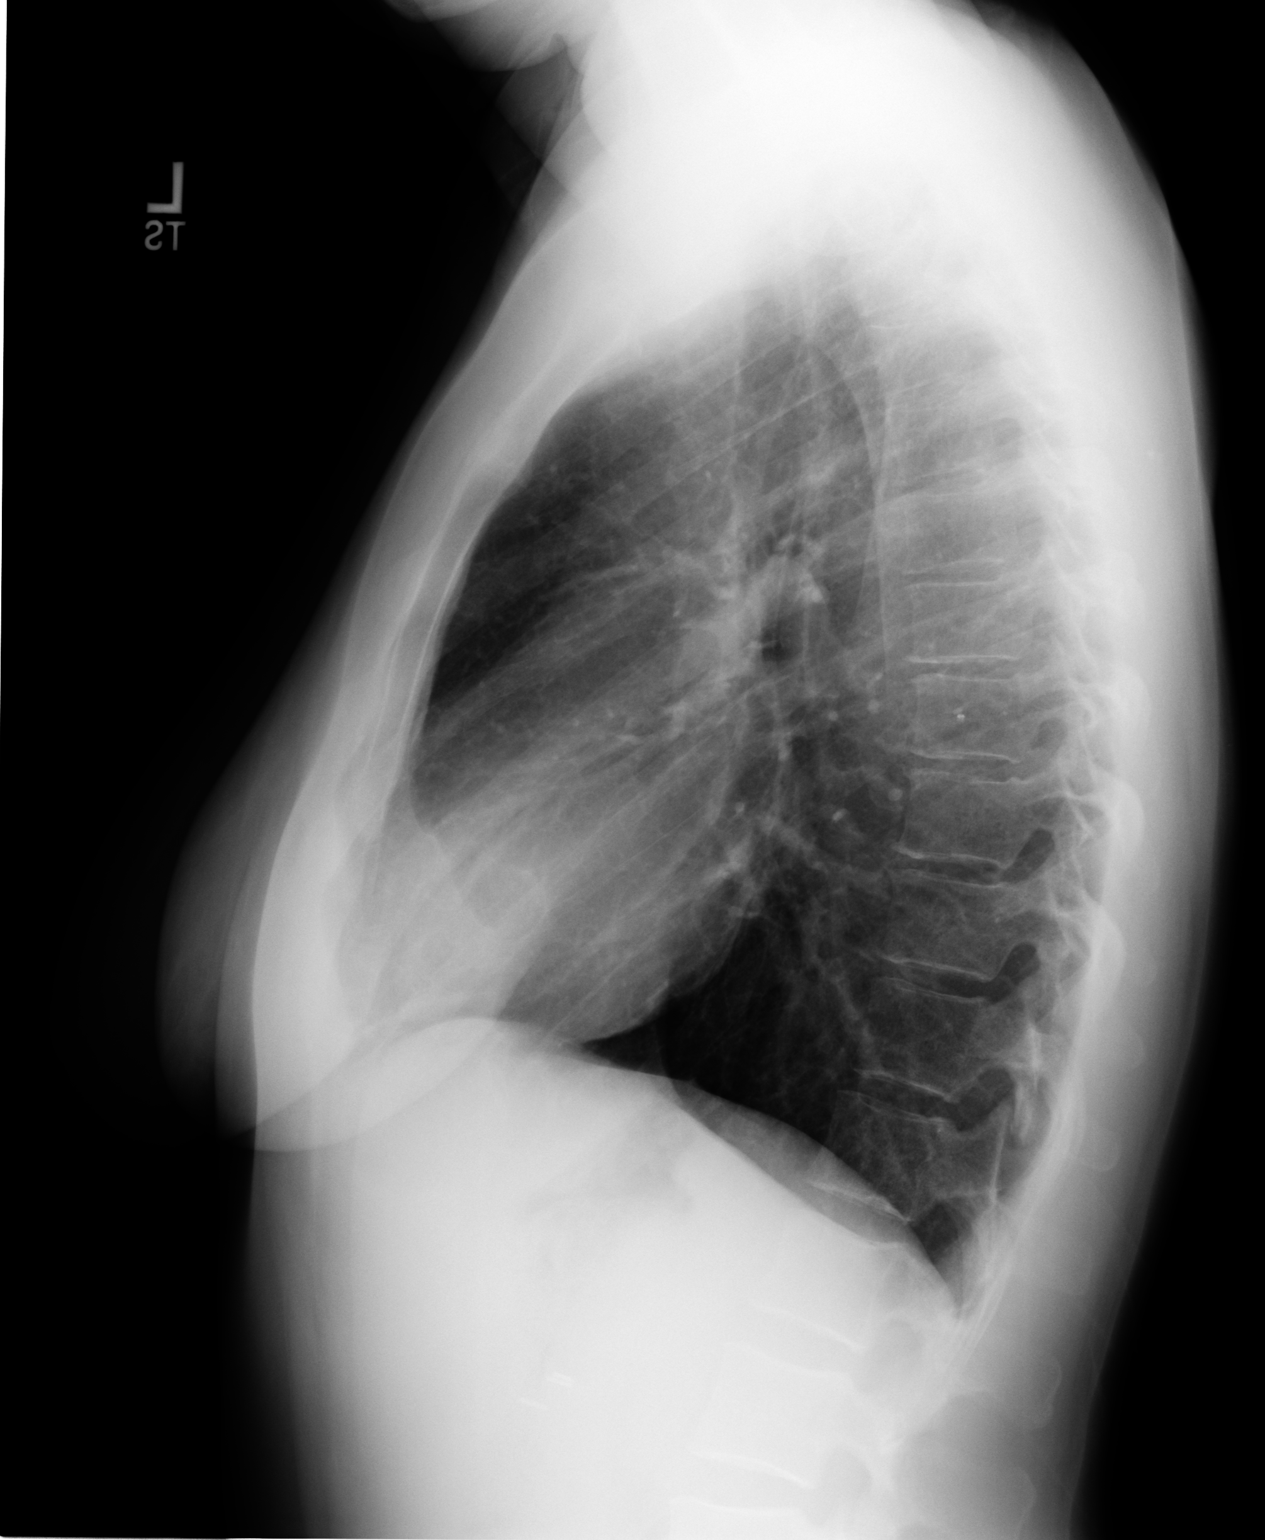

[2 of 2 positions shown; findings below may reference images not displayed]

IMPRESSION: No acute cardiopulmonary disease.

## 2013-03-17 ENCOUNTER — Emergency Department: Payer: Self-pay | Admitting: Emergency Medicine

## 2013-03-17 LAB — PHENYTOIN LEVEL, TOTAL: Dilantin: 1.1 ug/mL — ABNORMAL LOW (ref 10.0–20.0)

## 2015-11-27 ENCOUNTER — Emergency Department
Admission: EM | Admit: 2015-11-27 | Discharge: 2015-11-28 | Disposition: A | Discharge status: Other Acute Inpatient Hospital | Payer: Medicare Other | Attending: Emergency Medicine | Admitting: Emergency Medicine

## 2015-11-27 ENCOUNTER — Encounter: Payer: Self-pay | Admitting: Emergency Medicine

## 2015-11-27 DIAGNOSIS — F1920 Other psychoactive substance dependence, uncomplicated: Secondary | ICD-10-CM | POA: Diagnosis present

## 2015-11-27 DIAGNOSIS — F1910 Other psychoactive substance abuse, uncomplicated: Secondary | ICD-10-CM

## 2015-11-27 DIAGNOSIS — F319 Bipolar disorder, unspecified: Secondary | ICD-10-CM | POA: Insufficient documentation

## 2015-11-27 DIAGNOSIS — F1994 Other psychoactive substance use, unspecified with psychoactive substance-induced mood disorder: Secondary | ICD-10-CM | POA: Diagnosis not present

## 2015-11-27 DIAGNOSIS — F1020 Alcohol dependence, uncomplicated: Secondary | ICD-10-CM

## 2015-11-27 DIAGNOSIS — F17210 Nicotine dependence, cigarettes, uncomplicated: Secondary | ICD-10-CM | POA: Insufficient documentation

## 2015-11-27 DIAGNOSIS — G40909 Epilepsy, unspecified, not intractable, without status epilepticus: Secondary | ICD-10-CM

## 2015-11-27 DIAGNOSIS — F1420 Cocaine dependence, uncomplicated: Secondary | ICD-10-CM

## 2015-11-27 DIAGNOSIS — F10951 Alcohol use, unspecified with alcohol-induced psychotic disorder with hallucinations: Secondary | ICD-10-CM

## 2015-11-27 DIAGNOSIS — F1190 Opioid use, unspecified, uncomplicated: Secondary | ICD-10-CM | POA: Insufficient documentation

## 2015-11-27 DIAGNOSIS — F1490 Cocaine use, unspecified, uncomplicated: Secondary | ICD-10-CM | POA: Diagnosis not present

## 2015-11-27 HISTORY — DX: Unspecified convulsions: R56.9

## 2015-11-27 HISTORY — DX: Unspecified viral hepatitis C without hepatic coma: B19.20

## 2015-11-27 LAB — COMPREHENSIVE METABOLIC PANEL
ALK PHOS: 84 U/L (ref 38–126)
ALT: 24 U/L (ref 14–54)
ANION GAP: 9 (ref 5–15)
AST: 32 U/L (ref 15–41)
Albumin: 3.8 g/dL (ref 3.5–5.0)
BUN: <5 mg/dL — ABNORMAL LOW (ref 6–20)
CALCIUM: 8.9 mg/dL (ref 8.9–10.3)
CO2: 24 mmol/L (ref 22–32)
CREATININE: 0.61 mg/dL (ref 0.44–1.00)
Chloride: 105 mmol/L (ref 101–111)
Glucose, Bld: 102 mg/dL — ABNORMAL HIGH (ref 65–99)
Potassium: 3.4 mmol/L — ABNORMAL LOW (ref 3.5–5.1)
SODIUM: 138 mmol/L (ref 135–145)
TOTAL PROTEIN: 7 g/dL (ref 6.5–8.1)
Total Bilirubin: 0.3 mg/dL (ref 0.3–1.2)

## 2015-11-27 LAB — CBC
HCT: 41.1 % (ref 35.0–47.0)
HEMOGLOBIN: 14.4 g/dL (ref 12.0–16.0)
MCH: 28.6 pg (ref 26.0–34.0)
MCHC: 35 g/dL (ref 32.0–36.0)
MCV: 81.6 fL (ref 80.0–100.0)
Platelets: 354 10*3/uL (ref 150–440)
RBC: 5.04 MIL/uL (ref 3.80–5.20)
RDW: 12.9 % (ref 11.5–14.5)
WBC: 13.5 10*3/uL — ABNORMAL HIGH (ref 3.6–11.0)

## 2015-11-27 LAB — URINE DRUG SCREEN, QUALITATIVE (ARMC ONLY)
AMPHETAMINES, UR SCREEN: NOT DETECTED
BARBITURATES, UR SCREEN: NOT DETECTED
BENZODIAZEPINE, UR SCRN: NOT DETECTED
COCAINE METABOLITE, UR ~~LOC~~: POSITIVE — AB
Cannabinoid 50 Ng, Ur ~~LOC~~: NOT DETECTED
MDMA (Ecstasy)Ur Screen: NOT DETECTED
Methadone Scn, Ur: NOT DETECTED
OPIATE, UR SCREEN: NOT DETECTED
PHENCYCLIDINE (PCP) UR S: NOT DETECTED
Tricyclic, Ur Screen: NOT DETECTED

## 2015-11-27 LAB — ETHANOL: Alcohol, Ethyl (B): 23 mg/dL — ABNORMAL HIGH (ref <5)

## 2015-11-27 LAB — SALICYLATE LEVEL

## 2015-11-27 LAB — POCT PREGNANCY, URINE: Preg Test, Ur: NEGATIVE

## 2015-11-27 LAB — ACETAMINOPHEN LEVEL

## 2015-11-27 MED ORDER — LORAZEPAM 1 MG PO TABS
1.0000 mg | ORAL_TABLET | Freq: Four times a day (QID) | ORAL | Status: DC | PRN
  Administered 2015-11-27 – 2015-11-28 (×2): 1 mg via ORAL
  Filled 2015-11-27 (×2): qty 1

## 2015-11-27 MED ORDER — PHENYTOIN SODIUM EXTENDED 30 MG PO CAPS
30.0000 mg | ORAL_CAPSULE | Freq: Two times a day (BID) | ORAL | Status: DC
  Administered 2015-11-27 – 2015-11-28 (×2): 30 mg via ORAL
  Filled 2015-11-27 (×3): qty 1

## 2015-11-27 MED ORDER — METOCLOPRAMIDE HCL 10 MG PO TABS
20.0000 mg | ORAL_TABLET | ORAL | Status: AC
  Administered 2015-11-27: 20 mg via ORAL
  Filled 2015-11-27: qty 2

## 2015-11-27 MED ORDER — FOLIC ACID 1 MG PO TABS
1.0000 mg | ORAL_TABLET | Freq: Every day | ORAL | Status: DC
  Administered 2015-11-27 – 2015-11-28 (×2): 1 mg via ORAL
  Filled 2015-11-27 (×2): qty 1

## 2015-11-27 MED ORDER — ADULT MULTIVITAMIN W/MINERALS CH
1.0000 | ORAL_TABLET | Freq: Every day | ORAL | Status: DC
  Administered 2015-11-27 – 2015-11-28 (×2): 1 via ORAL
  Filled 2015-11-27 (×2): qty 1

## 2015-11-27 MED ORDER — THIAMINE HCL 100 MG/ML IJ SOLN: 100.0000 mg | Freq: Every day | INTRAMUSCULAR | Status: DC

## 2015-11-27 MED ORDER — LORAZEPAM 2 MG/ML IJ SOLN: 1.0000 mg | Freq: Four times a day (QID) | INTRAMUSCULAR | Status: DC | PRN

## 2015-11-27 MED ORDER — VITAMIN B-1 100 MG PO TABS
100.0000 mg | ORAL_TABLET | Freq: Every day | ORAL | Status: DC
  Administered 2015-11-27 – 2015-11-28 (×2): 100 mg via ORAL
  Filled 2015-11-27 (×2): qty 1

## 2015-11-27 NOTE — ED Notes (Signed)
Pt taken to ED BHU and at sally port noted to have two rings on her hand. Silver tone band ring and gold tone ring with six stones two clear and four dark colored removed from patient and placed in cup with name placed with belongings.

## 2015-11-27 NOTE — ED Notes (Signed)
BEHAVIORAL HEALTH ROUNDING  Patient sleeping: No.  Patient alert and oriented: yes  Behavior appropriate: Yes. ; If no, describe:  Nutrition and fluids offered: Yes  Toileting and hygiene offered: Yes  Sitter present: not applicable, Q 15 min safety rounds and observation.  Law enforcement present: Yes ODS ENVIRONMENTAL ASSESSMENT  Potentially harmful objects out of patient reach: Yes.  Personal belongings secured: Yes.  Patient dressed in hospital provided attire only: Yes.  Plastic bags out of patient reach: Yes.  Patient care equipment (cords, cables, call bells, lines, and drains) shortened, removed, or accounted for: Yes.  Equipment and supplies removed from bottom of stretcher: Yes.  Potentially toxic materials out of patient reach: Yes.  Sharps container removed or out of patient reach: Yes.   
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
## 2015-11-27 NOTE — ED Notes (Signed)

## 2015-11-27 NOTE — ED Triage Notes (Signed)
Pt reports having been clean for 3 years and relapsed 6 days ago. Using Heroin and Cocaine on a daily basis IV.  Pt states she injects into her neck and down her left lower leg where there is an abscess underneath the skin with track mark. Pt's neck is soft, but states she has difficulty swallowing. Pt is able to swallow her saliva. Denies any SI/HI.

## 2015-11-27 NOTE — ED Notes (Addendum)
Pt. To BHU from ED ambulatory without difficulty, to room  BHU 1. [facility] Pt. Is alert and oriented, warm and dry in no distress. Pt. Denies SI, HI. Pt states having auditory hallucinations but can't make out what they are saying because they are whispers. This Clinical research associatewriter assessed pt lower left leg for abscess. Area has slight bruising from IV drug use, no drainage.  Pt states she is having these because she has not had any sleep in the past few days and not on her medications.  Pt. Calm and cooperative. Pt. Made aware of security cameras and Q15 minute rounds. Pt. Encouraged to let Nursing staff know of any concerns or needs.

## 2015-11-27 NOTE — ED Notes (Signed)
ED BHU PLACEMENT JUSTIFICATION Is the patient under IVC or is there intent for IVC: Yes.   Is the patient medically cleared: Yes.   Is there vacancy in the ED BHU: Yes.   Is the population mix appropriate for patient: Yes.   Is the patient awaiting placement in inpatient or outpatient setting: No. Has the patient had a psychiatric consult: Yes.   Survey of unit performed for contraband, proper placement and condition of furniture, tampering with fixtures in bathroom, shower, and each patient room: Yes.  ; Findings: NA APPEARANCE/BEHAVIOR calm, cooperative and adequate rapport can be established NEURO ASSESSMENT Orientation: time, place and person Hallucinations: No.None noted (Hallucinations) Speech: Normal Gait: normal RESPIRATORY ASSESSMENT Normal expansion.  Clear to auscultation.  No rales, rhonchi, or wheezing. CARDIOVASCULAR ASSESSMENT regular rate and rhythm, S1, S2 normal, no murmur, click, rub or gallop GASTROINTESTINAL ASSESSMENT soft, nontender, BS WNL, no r/g EXTREMITIES normal strength, tone, and muscle mass PLAN OF CARE Provide calm/safe environment. Vital signs assessed twice daily. ED BHU Assessment once each 12-hour shift. Collaborate with intake RN daily or as condition indicates. Assure the ED provider has rounded once each shift. Provide and encourage hygiene. Provide redirection as needed. Assess for escalating behavior; address immediately and inform ED provider.  Assess family dynamic and appropriateness for visitation as needed: Yes.  ; If necessary, describe findings: NA Educate the patient/family about BHU procedures/visitation: Yes.  ; If necessary, describe findings: NA  

## 2015-11-27 NOTE — ED Notes (Signed)
Report to Kim, RN in ED BHU.  
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
## 2015-11-27 NOTE — ED Notes (Signed)
MD at bedside. 

## 2015-11-28 ENCOUNTER — Inpatient Hospital Stay
Admission: RE | Admit: 2015-11-28 | Discharge: 2015-12-02 | DRG: 885 | Payer: Medicare Other | Source: Intra-hospital | Attending: Psychiatry | Admitting: Psychiatry

## 2015-11-28 DIAGNOSIS — F319 Bipolar disorder, unspecified: Secondary | ICD-10-CM | POA: Diagnosis not present

## 2015-11-28 DIAGNOSIS — F1020 Alcohol dependence, uncomplicated: Secondary | ICD-10-CM | POA: Diagnosis present

## 2015-11-28 DIAGNOSIS — F3181 Bipolar II disorder: Secondary | ICD-10-CM | POA: Diagnosis not present

## 2015-11-28 DIAGNOSIS — Z809 Family history of malignant neoplasm, unspecified: Secondary | ICD-10-CM

## 2015-11-28 DIAGNOSIS — Z888 Allergy status to other drugs, medicaments and biological substances status: Secondary | ICD-10-CM

## 2015-11-28 DIAGNOSIS — F1420 Cocaine dependence, uncomplicated: Secondary | ICD-10-CM | POA: Diagnosis present

## 2015-11-28 DIAGNOSIS — R109 Unspecified abdominal pain: Secondary | ICD-10-CM

## 2015-11-28 DIAGNOSIS — Z9851 Tubal ligation status: Secondary | ICD-10-CM | POA: Diagnosis not present

## 2015-11-28 DIAGNOSIS — Z818 Family history of other mental and behavioral disorders: Secondary | ICD-10-CM

## 2015-11-28 DIAGNOSIS — F1410 Cocaine abuse, uncomplicated: Secondary | ICD-10-CM | POA: Diagnosis not present

## 2015-11-28 DIAGNOSIS — F10951 Alcohol use, unspecified with alcohol-induced psychotic disorder with hallucinations: Secondary | ICD-10-CM

## 2015-11-28 DIAGNOSIS — G40909 Epilepsy, unspecified, not intractable, without status epilepticus: Secondary | ICD-10-CM

## 2015-11-28 DIAGNOSIS — F1120 Opioid dependence, uncomplicated: Secondary | ICD-10-CM | POA: Diagnosis present

## 2015-11-28 DIAGNOSIS — Z9114 Patient's other noncompliance with medication regimen: Secondary | ICD-10-CM

## 2015-11-28 DIAGNOSIS — Z79899 Other long term (current) drug therapy: Secondary | ICD-10-CM | POA: Diagnosis not present

## 2015-11-28 DIAGNOSIS — F1010 Alcohol abuse, uncomplicated: Secondary | ICD-10-CM | POA: Diagnosis not present

## 2015-11-28 DIAGNOSIS — R1010 Upper abdominal pain, unspecified: Secondary | ICD-10-CM | POA: Diagnosis not present

## 2015-11-28 DIAGNOSIS — F17210 Nicotine dependence, cigarettes, uncomplicated: Secondary | ICD-10-CM | POA: Diagnosis present

## 2015-11-28 DIAGNOSIS — F1994 Other psychoactive substance use, unspecified with psychoactive substance-induced mood disorder: Secondary | ICD-10-CM | POA: Diagnosis not present

## 2015-11-28 DIAGNOSIS — Z9889 Other specified postprocedural states: Secondary | ICD-10-CM | POA: Diagnosis not present

## 2015-11-28 DIAGNOSIS — Z9049 Acquired absence of other specified parts of digestive tract: Secondary | ICD-10-CM | POA: Diagnosis not present

## 2015-11-28 DIAGNOSIS — Z915 Personal history of self-harm: Secondary | ICD-10-CM

## 2015-11-28 DIAGNOSIS — F1110 Opioid abuse, uncomplicated: Secondary | ICD-10-CM | POA: Diagnosis not present

## 2015-11-28 DIAGNOSIS — Z716 Tobacco abuse counseling: Secondary | ICD-10-CM

## 2015-11-28 DIAGNOSIS — Z8719 Personal history of other diseases of the digestive system: Secondary | ICD-10-CM | POA: Diagnosis not present

## 2015-11-28 DIAGNOSIS — F17200 Nicotine dependence, unspecified, uncomplicated: Secondary | ICD-10-CM | POA: Diagnosis present

## 2015-11-28 DIAGNOSIS — R1084 Generalized abdominal pain: Secondary | ICD-10-CM | POA: Diagnosis present

## 2015-11-28 DIAGNOSIS — Z881 Allergy status to other antibiotic agents status: Secondary | ICD-10-CM | POA: Diagnosis not present

## 2015-11-28 DIAGNOSIS — B1920 Unspecified viral hepatitis C without hepatic coma: Secondary | ICD-10-CM | POA: Diagnosis not present

## 2015-11-28 MED ORDER — LORAZEPAM 0.5 MG PO TABS
1.0000 mg | ORAL_TABLET | Freq: Four times a day (QID) | ORAL | Status: DC | PRN
  Administered 2015-11-29: 1 mg via ORAL
  Filled 2015-11-28: qty 2

## 2015-11-28 MED ORDER — MAGNESIUM HYDROXIDE 400 MG/5ML PO SUSP: 30.0000 mL | Freq: Every day | ORAL | Status: DC | PRN

## 2015-11-28 MED ORDER — NICOTINE 21 MG/24HR TD PT24
21.0000 mg | MEDICATED_PATCH | Freq: Every day | TRANSDERMAL | Status: DC
  Administered 2015-11-28 – 2015-12-02 (×5): 21 mg via TRANSDERMAL
  Filled 2015-11-28 (×5): qty 1

## 2015-11-28 MED ORDER — ADULT MULTIVITAMIN W/MINERALS CH
1.0000 | ORAL_TABLET | Freq: Every day | ORAL | Status: DC
  Administered 2015-11-29 – 2015-12-02 (×4): 1 via ORAL
  Filled 2015-11-28 (×4): qty 1

## 2015-11-28 MED ORDER — CHLORDIAZEPOXIDE HCL 25 MG PO CAPS
25.0000 mg | ORAL_CAPSULE | Freq: Once | ORAL | Status: DC
  Administered 2015-11-28: 25 mg via ORAL
  Filled 2015-11-28: qty 1

## 2015-11-28 MED ORDER — ACETAMINOPHEN 325 MG PO TABS
650.0000 mg | ORAL_TABLET | Freq: Once | ORAL | Status: AC
  Administered 2015-11-28: 650 mg via ORAL
  Filled 2015-11-28: qty 2

## 2015-11-28 MED ORDER — PHENYTOIN SODIUM EXTENDED 100 MG PO CAPS
300.0000 mg | ORAL_CAPSULE | Freq: Every day | ORAL | Status: DC
Start: 2015-11-28 — End: 2015-12-02
  Administered 2015-11-28 – 2015-12-01 (×4): 300 mg via ORAL
  Filled 2015-11-28 (×5): qty 3

## 2015-11-28 MED ORDER — QUETIAPINE FUMARATE 25 MG PO TABS: 100.0000 mg | ORAL_TABLET | Freq: Every day | ORAL | Status: DC

## 2015-11-28 MED ORDER — CHLORDIAZEPOXIDE HCL 25 MG PO CAPS: 25.0000 mg | ORAL_CAPSULE | Freq: Once | ORAL | Status: DC

## 2015-11-28 MED ORDER — VITAMIN B-1 100 MG PO TABS
100.0000 mg | ORAL_TABLET | Freq: Every day | ORAL | Status: DC
  Administered 2015-11-29 – 2015-12-02 (×4): 100 mg via ORAL
  Filled 2015-11-28 (×4): qty 1

## 2015-11-28 MED ORDER — LORAZEPAM 2 MG/ML IJ SOLN: 1.0000 mg | Freq: Four times a day (QID) | INTRAMUSCULAR | Status: DC | PRN

## 2015-11-28 MED ORDER — FOLIC ACID 1 MG PO TABS
1.0000 mg | ORAL_TABLET | Freq: Every day | ORAL | Status: DC
  Administered 2015-11-29 – 2015-12-02 (×4): 1 mg via ORAL
  Filled 2015-11-28 (×4): qty 1

## 2015-11-28 MED ORDER — ALUM & MAG HYDROXIDE-SIMETH 200-200-20 MG/5ML PO SUSP: 30.0000 mL | ORAL | Status: DC | PRN

## 2015-11-28 MED ORDER — GABAPENTIN 300 MG PO CAPS
1800.0000 mg | ORAL_CAPSULE | Freq: Two times a day (BID) | ORAL | Status: DC
Start: 2015-11-28 — End: 2015-11-28

## 2015-11-28 MED ORDER — THIAMINE HCL 100 MG/ML IJ SOLN
100.0000 mg | Freq: Every day | INTRAMUSCULAR | Status: DC
  Filled 2015-11-28: qty 1

## 2015-11-28 MED ORDER — PHENYTOIN SODIUM EXTENDED 100 MG PO CAPS: 300.0000 mg | ORAL_CAPSULE | Freq: Every day | ORAL | Status: DC

## 2015-11-28 MED ORDER — QUETIAPINE FUMARATE 100 MG PO TABS
100.0000 mg | ORAL_TABLET | Freq: Every day | ORAL | Status: DC
  Administered 2015-11-28: 100 mg via ORAL
  Filled 2015-11-28: qty 1

## 2015-11-28 MED ORDER — GABAPENTIN 400 MG PO CAPS
1800.0000 mg | ORAL_CAPSULE | Freq: Two times a day (BID) | ORAL | Status: DC
  Administered 2015-11-28 – 2015-12-02 (×9): 1800 mg via ORAL
  Filled 2015-11-28 (×9): qty 2

## 2015-11-28 MED ORDER — ACETAMINOPHEN 325 MG PO TABS
650.0000 mg | ORAL_TABLET | Freq: Four times a day (QID) | ORAL | Status: DC | PRN
Start: 2015-11-28 — End: 2015-12-02
  Administered 2015-11-28 – 2015-12-01 (×3): 650 mg via ORAL
  Filled 2015-11-28 (×3): qty 2

## 2015-11-28 NOTE — ED Provider Notes (Signed)
-----------------------------------------   7:03 AM on 11/28/2015 -----------------------------------------   Blood pressure (!) 104/52, pulse 63, temperature 98.1 F (36.7 C), temperature source Oral, resp. rate 16, height 5\' 9"  (1.753 m), weight 142 lb (64.4 kg), last menstrual period 11/20/2015, SpO2 98 %.  The patient had no acute events since last update.  Calm and cooperative at this time.  Disposition is pending Psychiatry/Behavioral Medicine team recommendations.     Irean HongJade J Neziah Braley, MD 11/28/15 [phone]

## 2015-11-28 NOTE — Plan of Care (Signed)
Problem: Safety: Goal: Ability to remain free from injury will improve Outcome: Progressing No seizure activities, no falls, will continue to monitor.

## 2015-11-28 NOTE — Progress Notes (Signed)
[facility] with reports of poor self care lately and a seizure that caused pt to come to Ed. Pt reports she has not been sleeping or eating well lately with an increase in hopelessness. Reports alcohol, heroin and cocaine use. UDS positive for cocaine, negative for heroin. Blood alcohol level 23 on admission. Reports withdrawal symptoms to include sweating, nausea, and anxiety. Pt currently homeless, but was recently living with an aunt whom she reports she can possibly go back to live with. Also reports recently spent 20 days in jail. Mother died 2 months ago. Mother was pt's reported best source of support. Pt does report relationship with her sister and aunt.   Skin/contraband search completed. Injection sites reportedly from cocaine use noted to L neck, R ankle, and R breast. Multiple tattoos noted to mid-upper back, R/L arms. Oriented pt to room/unit. Safety checks every 15 minutes begin for safety. Room close to nurse's station for moderate fall risk d/t seizure history. CIWA q 6 as ordered. Alcohol withdrawal as ordered. Will continue to monitor.

## 2015-11-28 NOTE — Progress Notes (Signed)
Patient ID: Megan Shaffer, female   DOB: 05/25/1970, 45 y.o.   MRN: [id number] Per State regulations 482.30 this chart was reviewed for medical necessity with respect to the patient's admission/duration of stay.    Next review date: 12/02/15  Thurman CoyerEric Markee Matera, BSN, RN-BC  Case Manager
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
## 2015-11-28 NOTE — BH Assessment (Signed)
[facility] Toni Amendlapacs.  Attending Physician will be Dr. Jennet MaduroPucilowska.   Patient has [facility] room 302[facility] Nurse WillitsGwen F.   Intake Paper Work has been signed and placed on patient chart.  ER staff is aware of the admission Maia Breslow([facility].; Dr. Pershing ProudSchaevitz, ER MD; Sue LushAndrea, Patient's Nurse & Angelique Blonderenise, Patient Access).

## 2015-11-28 NOTE — B[facility] Group Notes (Signed)
[facility] Group Notes:  (Nursing/MHT/Case Management/Adjunct)  Date:  11/28/2015  Time:  9:40 PM  Type of Therapy:  Evening Wrap-up Group  Participation Level:  Did Not Attend  Participation Quality:  N/A  Affect:  N/A  Cognitive:  N/A  Insight:  None  Engagement in Group:  Did Not Attend  Modes of Intervention:  Activity  Summary of Progress/Problems:  Megan MorrowChelsea Shaffer Megan Shaffer 11/28/2015, 9:40 PM

## 2015-11-28 NOTE — Progress Notes (Deleted)
Report called to [facility] RN Toniann FailWendy

## 2015-11-28 NOTE — Tx Team (Signed)
Initial Treatment Plan 11/28/2015 6:16 PM Pricilla LarssonShenna M Mathias [id number]    PATIENT STRESSORS: Financial difficulties Health problems Legal issue Loss of mother Medication change or noncompliance Substance abuse   PATIENT STRENGTHS: Motivation for treatment/growth Supportive family/friends   PATIENT IDENTIFIED PROBLEMS: Substance abuse-alcohol and cocaine    Seizures/withdrawal to alcohol     "I haven't been taking care of myself"             DISCHARGE CRITERIA:  Ability to meet basic life and health needs Adequate post-discharge living arrangements Improved stabilization in mood, thinking, and/or behavior Medical problems require only outpatient monitoring Withdrawal symptoms are absent or subacute and managed without 24-hour nursing intervention  PRELIMINARY DISCHARGE PLAN: Attend aftercare/continuing care group Outpatient therapy Placement in alternative living arrangements  PATIENT/FAMILY INVOLVEMENT: This treatment plan has been presented to and reviewed with the patient, Pricilla LarssonShenna M Deckman, and/or family member, .  The patient and family have been given the opportunity to ask questions and make suggestions.  Tonye PearsonAmanda N Emylia Latella, RN 11/28/2015, 6:16 PM

## 2015-11-28 NOTE — Consult Note (Signed)
[facility] Face-to-Face Psychiatry Consult   Reason for Consult:  Consult for 45 year old woman with a history of absence abuse depression and seizures. Presents after a seizure but also with suicidal ideation. Referring Physician:  Alfred Levins Patient Identification: Megan Shaffer MRN:  [id number] Principal Diagnosis: Substance induced mood disorder [facility]) Diagnosis:   Patient Active Problem List   Diagnosis Date Noted  . Alcohol abuse [F10.10] 11/28/2015  . Seizure disorder ([place]) [Q30.092] 11/28/2015  . Cocaine abuse [F14.10] 11/28/2015  . Substance induced mood disorder ([place]) [F19.94] 11/28/2015  . 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 female patient admitted with "I was hearing voices".  HPI:  Patient interviewed. Chart reviewed. Labs and vitals reviewed. 42 year old woman presented via ambulance after a neighbor witnessed her having a seizure. Patient says that her mood is been bad recently. About one month she has been feeling angry and depressed. Sleep is been very poor. She's not eating well and not taking care of herself. She's been having thoughts about feeling hopeless and wishing she were dead. Denies any intention to kill her self. She says she is not currently taking any psychiatric medicine. She just got out of jail within the last couple weeks and has been staying here in town with an aunt and not going to any follow-up treatment. She says that she's been drinking up to a fifth of liquor a day. Also claims that she's using heroin and cocaine. Drug screen is positive for cocaine but not for narcotics.  Social history: Just out of jail recently. Mother died a few months ago. Not working. Minimal support. Not going to any outpatient treatment.  Medical history: History of seizure disorder by her report. Possibly related to alcohol abuse. Has been treated with medication for epilepsy. Ran out of her  Neurontin about a week ago which she thinks is why she had a seizure. Denies knowing of any other active medical problems although hepatitis C is listed in her chart.  Substance abuse history: Long history of abuse of multiple drugs including alcohol or 1 and cocaine. She is not showing any insight into a relationship between the alcohol and her seizures. She does have a history of some substance abuse treatment but it has been years since she was participating.  Past Psychiatric History: Patient has had previous psychiatric hospitalizations but has been a long time. He is to follow-up with [facility] in the past. Not currently getting any psychiatric treatment. Says that she used to take Seroquel in addition to Neurontin and Dilantin. Denies any past history of suicide attempts.  Risk to Self: Suicidal Ideation: Yes-Currently Present Suicidal Intent: No Is patient at risk for suicide?: No Suicidal Plan?: No Access to Means: Yes Specify Access to Suicidal Means: access to pills/ knifes What has been your use of drugs/alcohol within the last 12 months?: cociane and Heroine How many times?: 1 Other Self Harm Risks: damaging Triggers for Past Attempts: Unpredictable Intentional Self Injurious Behavior: Damaging Comment - Self Injurious Behavior: punches objects Risk to Others: Homicidal Ideation: No Thoughts of Harm to Others: No Current Homicidal Intent: No Current Homicidal Plan: No Access to Homicidal Means: No Identified Victim: none History of harm to others?: No Assessment of Violence: None Noted Violent Behavior Description: n/a Does patient have access to weapons?: No Criminal Charges Pending?: No Does patient have a court date: No Prior Inpatient Therapy: Prior Inpatient Therapy: Yes  Prior Therapy Dates: 2013/2014 Prior Therapy Facilty/Provider(s): [facility], [facility] Reason for Treatment: Bipolar, S.A. Prior Outpatient Therapy: Prior Outpatient Therapy: Yes Prior Therapy Dates:  unspecified Prior Therapy Facilty/Provider(s): [facility] Reason for Treatment: bipolar Does patient have an ACCT team?: No Does patient have Intensive In-House Services?  : No Does patient have [facility] services? : No Does patient have P4CC services?: No  Past Medical History:  Past Medical History:  Diagnosis Date  . Hepatitis C   . Seizures ([place])     Past Surgical History:  Procedure Laterality Date  . ABDOMINAL SURGERY    . CHOLECYSTECTOMY    . TUBAL LIGATION     Family History: History reviewed. No pertinent family history. Family Psychiatric  History: Family history positive for bipolar disorder on both sides of the family. Social History:  History  Alcohol Use  . Yes     History  Drug Use  . Types: IV, Cocaine    Comment: also Heroin    Social History   Social History  . Marital status: Divorced    Spouse name: N/A  . Number of children: N/A  . Years of education: N/A   Social History Main Topics  . Smoking status: Current Every Day Smoker    Packs/day: 1.00    Types: Cigarettes  . Smokeless tobacco: Never Used  . Alcohol use Yes  . Drug use:     Types: IV, Cocaine     Comment: also Heroin  . Sexual activity: Not Asked   Other Topics Concern  . None   Social History Narrative  . None   Additional Social History:    Allergies:   Allergies  Allergen Reactions  . Ciprofloxacin Other (See Comments)    Unknown reaction  . Famotidine Other (See Comments)    Unknown reaction.  Lindajo [place] [Ziprasidone Hcl] Other (See Comments)    Unknown reaction  . Imitrex [Sumatriptan] Other (See Comments)    Unknown reaction  . Toradol [Ketorolac Tromethamine] Other (See Comments)    Unknown reaction  . Zofran [Ondansetron Hcl] Other (See Comments)    Unknown reaction    Labs:  Results for orders placed or performed during the hospital encounter of 11/27/15 (from the past 48 hour(s))  Ethanol     Status: Abnormal   Collection Time:  11/27/15  4:02 PM  Result Value Ref Range   Alcohol, Ethyl (B) 23 (H) <5 mg/dL    Comment:        LOWEST DETECTABLE LIMIT FOR SERUM ALCOHOL IS 5 mg/dL FOR MEDICAL PURPOSES ONLY   Urine Drug Screen, Qualitative     Status: Abnormal   Collection Time: 11/27/15  4:02 PM  Result Value Ref Range   Tricyclic, Ur Screen NONE DETECTED NONE DETECTED   Amphetamines, Ur Screen NONE DETECTED NONE DETECTED   MDMA (Ecstasy)Ur Screen NONE DETECTED NONE DETECTED   Cocaine Metabolite,Ur [place] POSITIVE (A) NONE DETECTED   Opiate, Ur Screen NONE DETECTED NONE DETECTED   Phencyclidine (PCP) Ur S NONE DETECTED NONE DETECTED   Cannabinoid 50 Ng, Ur [place] NONE DETECTED NONE DETECTED   Barbiturates, Ur Screen NONE DETECTED NONE DETECTED   Benzodiazepine, Ur Scrn NONE DETECTED NONE DETECTED   Methadone Scn, Ur NONE DETECTED NONE DETECTED    Comment: (NOTE) 263  Tricyclics, urine               Cutoff 1000 ng/mL 200  Amphetamines, urine  Cutoff 1000 ng/mL 300  MDMA (Ecstasy), urine           Cutoff 500 ng/mL 400  Cocaine Metabolite, urine       Cutoff 300 ng/mL 500  Opiate, urine                   Cutoff 300 ng/mL 600  Phencyclidine (PCP), urine      Cutoff 25 ng/mL 700  Cannabinoid, urine              Cutoff 50 ng/mL 800  Barbiturates, urine             Cutoff 200 ng/mL 900  Benzodiazepine, urine           Cutoff 200 ng/mL 1000 Methadone, urine                Cutoff 300 ng/mL 1100 1200 The urine drug screen provides only a preliminary, unconfirmed 1300 analytical test result and should not be used for non-medical 1400 purposes. Clinical consideration and professional judgment should 1500 be applied to any positive drug screen result due to possible 1600 interfering substances. A more specific alternate chemical method 1700 must be used in order to obtain a confirmed analytical result.  1800 Gas chromato graphy / mass spectrometry (GC/MS) is the preferred 1900 confirmatory method.   Pregnancy,  urine POC     Status: None   Collection Time: 11/27/15  4:08 PM  Result Value Ref Range   Preg Test, Ur NEGATIVE NEGATIVE    Comment:        THE SENSITIVITY OF THIS METHODOLOGY IS >24 mIU/mL   CBC     Status: Abnormal   Collection Time: 11/27/15  4:32 PM  Result Value Ref Range   WBC 13.5 (H) 3.6 - 11.0 K/uL   RBC 5.04 3.80 - 5.20 MIL/uL   Hemoglobin 14.4 12.0 - 16.0 g/dL   HCT 41.1 35.0 - 47.0 %   MCV 81.6 80.0 - 100.0 fL   MCH 28.6 26.0 - 34.0 pg   MCHC 35.0 32.0 - 36.0 g/dL   RDW 12.9 11.5 - 14.5 %   Platelets 354 150 - 440 K/uL  Comprehensive metabolic panel     Status: Abnormal   Collection Time: 11/27/15  4:32 PM  Result Value Ref Range   Sodium 138 135 - 145 mmol/L   Potassium 3.4 (L) 3.5 - 5.1 mmol/L   Chloride 105 101 - 111 mmol/L   CO2 24 22 - 32 mmol/L   Glucose, Bld 102 (H) 65 - 99 mg/dL   BUN <5 (L) 6 - 20 mg/dL   Creatinine, Ser 0.61 0.44 - 1.00 mg/dL   Calcium 8.9 8.9 - 10.3 mg/dL   Total Protein 7.0 6.5 - 8.1 g/dL   Albumin 3.8 3.5 - 5.0 g/dL   AST 32 15 - 41 U/L   ALT 24 14 - 54 U/L   Alkaline Phosphatase 84 38 - 126 U/L   Total Bilirubin 0.3 0.3 - 1.2 mg/dL   GFR calc non Af Amer >60 >60 mL/min   GFR calc Af Amer >60 >60 mL/min    Comment: (NOTE) The eGFR has been calculated using the CKD EPI equation. This calculation has not been validated in all clinical situations. eGFR's persistently <60 mL/min signify possible Chronic Kidney Disease.    Anion gap 9 5 - 15  Acetaminophen level     Status: Abnormal   Collection Time: 11/27/15  4:32 PM  Result Value Ref  Range   Acetaminophen (Tylenol), Serum <10 (L) 10 - 30 ug/mL    Comment:        THERAPEUTIC CONCENTRATIONS VARY SIGNIFICANTLY. A RANGE OF 10-30 ug/mL MAY BE AN EFFECTIVE CONCENTRATION FOR MANY PATIENTS. HOWEVER, SOME ARE BEST TREATED AT CONCENTRATIONS OUTSIDE THIS RANGE. ACETAMINOPHEN CONCENTRATIONS >150 ug/mL AT 4 HOURS AFTER INGESTION AND >50 ug/mL AT 12 HOURS AFTER INGESTION  ARE OFTEN ASSOCIATED WITH TOXIC REACTIONS.   Salicylate level     Status: None   Collection Time: 11/27/15  4:32 PM  Result Value Ref Range   Salicylate Lvl <9.9 2.8 - 30.0 mg/dL    Current Facility-Administered Medications  Medication Dose Route Frequency Provider Last Rate Last Dose  . chlordiazePOXIDE (LIBRIUM) capsule 25 mg  25 mg Oral Once Gonzella Lex, MD      . folic acid (FOLVITE) tablet 1 mg  1 mg Oral Daily Delman Kitten, MD   1 mg at 11/28/15 0944  . gabapentin (NEURONTIN) capsule 1,800 mg  1,800 mg Oral BID Gonzella Lex, MD      . LORazepam (ATIVAN) tablet 1 mg  1 mg Oral Q6H PRN Delman Kitten, MD   1 mg at 11/27/15 2156   Or  . LORazepam (ATIVAN) injection 1 mg  1 mg Intravenous Q6H PRN Delman Kitten, MD      . multivitamin with minerals tablet 1 tablet  1 tablet Oral Daily Delman Kitten, MD   1 tablet at 11/28/15 0944  . phenytoin (DILANTIN) ER capsule 300 mg  300 mg Oral QHS Gonzella Lex, MD      . QUEtiapine (SEROQUEL) tablet 100 mg  100 mg Oral QHS Gonzella Lex, MD      . thiamine (VITAMIN B-1) tablet 100 mg  100 mg Oral Daily Delman Kitten, MD   100 mg at 11/28/15 0944   Or  . thiamine (B-1) injection 100 mg  100 mg Intravenous Daily Delman Kitten, MD       Current Outpatient Prescriptions  Medication Sig Dispense Refill  . gabapentin (NEURONTIN) 600 MG tablet Take 1,800 mg by mouth 2 (two) times daily.    . phenytoin (DILANTIN) 100 MG ER capsule Take 300 mg by mouth at bedtime.       Musculoskeletal: Strength & Muscle Tone: within normal limits Gait & Station: ataxic Patient leans: N/A  Psychiatric Specialty Exam: Physical Exam  Nursing note and vitals reviewed. Constitutional: She appears well-nourished. She appears distressed.  HENT:  Head: Normocephalic and atraumatic.  Eyes: Conjunctivae are normal. Pupils are equal, round, and reactive to light.  Neck: Normal range of motion.  Cardiovascular: Regular rhythm and normal heart sounds.   Respiratory: Effort  normal. No respiratory distress.  GI: Soft.  Musculoskeletal: Normal range of motion.  Neurological: She is alert.  Skin: Skin is warm and dry.     Psychiatric: Her mood appears anxious. Her affect is blunt. Her speech is delayed. She is slowed. Cognition and memory are impaired. She expresses impulsivity. She exhibits a depressed mood. She expresses suicidal ideation. She expresses no suicidal plans.    Review of Systems  Constitutional: Positive for malaise/fatigue and weight loss.  HENT: Negative.   Eyes: Negative.   Respiratory: Negative.   Cardiovascular: Negative.   Gastrointestinal: Negative.   Musculoskeletal: Negative.   Skin: Negative.   Neurological: Positive for seizures.  Psychiatric/Behavioral: Positive for depression, hallucinations, memory loss, substance abuse and suicidal ideas. The patient is nervous/anxious and has insomnia.  Blood pressure 112/62, pulse 87, temperature 98.4 F (36.9 C), temperature source Oral, resp. rate 20, height _0  (1.753 m), weight 64.4 kg (142 lb), last menstrual period 11/20/2015, SpO2 97 %.Body mass index is 20.97 kg/m.  General Appearance: Disheveled  Eye Contact:  Minimal  Speech:  Garbled  Volume:  Decreased  Mood:  Anxious and Depressed  Affect:  Depressed  Thought Process:  Goal Directed  Orientation:  Full (Time, Place, and Person)  Thought Content:  Hallucinations: Auditory  Suicidal Thoughts:  Yes.  without intent/plan  Homicidal Thoughts:  No  Memory:  Immediate;   Good Recent;   Fair Remote;   Fair  Judgement:  Fair  Insight:  Fair  Psychomotor Activity:  Decreased  Concentration:  Concentration: Fair  Recall:  [employer] of Knowledge:  Fair  Language:  Fair  Akathisia:  No  Handed:  Right  AIMS (if indicated):     Assets:  Desire for Improvement  ADL's:  Impaired  Cognition:  WNL  Sleep:        Treatment Plan Summary: Daily contact with patient to assess and evaluate symptoms and progress in  treatment, Medication management and Plan Patient will be admitted to psychiatric service diagnosis of substance-induced mood disorder. CIWA protocol in place. Increase Dilantin to her quoted dose of 300 mg at night and restart gabapentin at 1800 mg twice a day. Restart Seroquel 100 mg at night. 15 minute checks in place. Full complement of labs will be done.  Disposition: Recommend psychiatric Inpatient admission when medically cleared. Supportive therapy provided about ongoing stressors.  Alethia Berthold, MD 11/28/2015 3:03 PM

## 2015-11-28 NOTE — ED Notes (Signed)
[facility] ChessmanMandy

## 2015-11-29 DIAGNOSIS — F1120 Opioid dependence, uncomplicated: Secondary | ICD-10-CM | POA: Diagnosis present

## 2015-11-29 DIAGNOSIS — F3181 Bipolar II disorder: Principal | ICD-10-CM

## 2015-11-29 LAB — LIPID PANEL
CHOL/HDL RATIO: 3.2 ratio
CHOLESTEROL: 120 mg/dL (ref 0–200)
HDL: 37 mg/dL — ABNORMAL LOW (ref >40)
LDL Cholesterol: 55 mg/dL (ref 0–99)
Triglycerides: 141 mg/dL (ref <150)
VLDL: 28 mg/dL (ref 0–40)

## 2015-11-29 LAB — HEMOGLOBIN A1C: HEMOGLOBIN A1C: 5.1 % (ref 4.0–6.0)

## 2015-11-29 LAB — TSH: TSH: 1.263 u[IU]/mL (ref 0.350–4.500)

## 2015-11-29 MED ORDER — CHLORDIAZEPOXIDE HCL 10 MG PO CAPS
10.0000 mg | ORAL_CAPSULE | Freq: Four times a day (QID) | ORAL | Status: AC
  Administered 2015-11-29 – 2015-12-01 (×8): 10 mg via ORAL
  Filled 2015-11-29 (×8): qty 1

## 2015-11-29 MED ORDER — LORAZEPAM 0.5 MG PO TABS
1.0000 mg | ORAL_TABLET | Freq: Once | ORAL | Status: AC
  Administered 2015-11-29: 1 mg via ORAL
  Filled 2015-11-29: qty 2

## 2015-11-29 MED ORDER — BUPRENORPHINE HCL 2 MG SL SUBL
4.0000 mg | SUBLINGUAL_TABLET | Freq: Every day | SUBLINGUAL | Status: DC
  Administered 2015-11-29 – 2015-12-02 (×4): 4 mg via SUBLINGUAL
  Filled 2015-11-29 (×4): qty 2

## 2015-11-29 MED ORDER — CEPHALEXIN 500 MG PO CAPS
500.0000 mg | ORAL_CAPSULE | Freq: Three times a day (TID) | ORAL | Status: DC
  Administered 2015-11-29 – 2015-12-02 (×10): 500 mg via ORAL
  Filled 2015-11-29 (×12): qty 1

## 2015-11-29 MED ORDER — QUETIAPINE FUMARATE 200 MG PO TABS: 200.0000 mg | ORAL_TABLET | Freq: Every day | ORAL | Status: DC

## 2015-11-29 MED ORDER — QUETIAPINE FUMARATE 100 MG PO TABS
100.0000 mg | ORAL_TABLET | Freq: Every day | ORAL | Status: DC
  Administered 2015-11-29: 100 mg via ORAL
  Filled 2015-11-29: qty 1

## 2015-11-29 MED ORDER — QUETIAPINE FUMARATE 200 MG PO TABS
300.0000 mg | ORAL_TABLET | Freq: Every day | ORAL | Status: DC
  Administered 2015-11-29 – 2015-12-01 (×3): 300 mg via ORAL
  Filled 2015-11-29 (×3): qty 1

## 2015-11-29 NOTE — [facility] Suicide Risk Assessment (Signed)
[facility] INPATIENT:  Family/Significant Other Suicide Prevention Education  Suicide Prevention Education:  Patient Refusal for Family/Significant Other Suicide Prevention Education: The patient Megan Shaffer has refused to provide written consent for family/significant other to be provided Family/Significant Other Suicide Prevention Education during admission and/or prior to discharge.  Physician notified.  Glennon MacSara P Nuvia Hileman, MSW, LCSW 11/29/2015, 5:12 PM

## 2015-11-29 NOTE — [facility] Suicide Risk Assessment (Signed)
[facility] Admission Suicide Risk Assessment   Nursing information obtained from:  Patient Demographic factors:  Divorced or widowed, Caucasian, Low socioeconomic status, Living alone, Unemployed Current Mental Status:  Suicidal ideation indicated by patient Loss Factors:  Decline in physical health, Legal issues, Financial problems / change in socioeconomic status Historical Factors:  Prior suicide attempts Risk Reduction Factors:  Living with another person, especially a relative (was living with aunt, reports she can go back to aunt's)  Total Time spent with patient: 1 hour Principal Problem: Bipolar 2 disorder, major depressive episode (HCC) Diagnosis:   Patient Active Problem List   Diagnosis Date Noted  . Bipolar 2 disorder, major depressive episode (HCC) [F31.81] 11/29/2015  . Opioid use disorder, moderate, dependence (HCC) [F11.20] 11/29/2015  . Alcohol use disorder, moderate, dependence (HCC) [F10.20] 11/28/2015  . Seizure disorder (HCC) [G40.909] 11/28/2015  . Cocaine use disorder, moderate, dependence (HCC) [F14.20] 11/28/2015  . Substance induced mood disorder (HCC) [F19.94] 11/28/2015  . 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 Tobacco use disorder [F17.200] 11/28/2015   Subjective Data: depression, suicidal ideation, substance abuse.  Continued Clinical Symptoms:  Alcohol Use Disorder Identification Test Final Score (AUDIT): 26 The "Alcohol Use Disorders Identification Test", Guidelines for Use in Primary Care, Second Edition.  World Science writer [facility]). Score between 0-7:  no or low risk or alcohol related problems. Score between 8-15:  moderate risk of alcohol related problems. Score between 16-19:  high risk of alcohol related problems. Score 20 or above:  warrants further diagnostic evaluation for alcohol dependence and treatment.   CLINICAL FACTORS:   Bipolar Disorder:   Bipolar II Depressive phase Alcohol/Substance  Abuse/Dependencies   Musculoskeletal: Strength & Muscle Tone: within normal limits Gait & Station: normal Patient leans: N/A  Psychiatric Specialty Exam: Physical Exam  Nursing note and vitals reviewed.   Review of Systems  Gastrointestinal: Positive for nausea.  Musculoskeletal: Positive for myalgias.  Psychiatric/Behavioral: Positive for depression, substance abuse and suicidal ideas. The patient is nervous/anxious and has insomnia.   All other systems reviewed and are negative.   Blood pressure 126/76, pulse (!) 108, temperature 98.7 F (37.1 C), temperature source Oral, resp. rate 20, height 5\' 7"  (1.702 m), weight 68.9 kg (152 lb), last menstrual period 11/20/2015, SpO2 99 %.Body mass index is 23.81 kg/m.  General Appearance: Fairly Groomed  Eye Contact:  Fair  Speech:  Clear and Coherent  Volume:  Normal  Mood:  Depressed, Hopeless and Worthless  Affect:  Tearful  Thought Process:  Goal Directed  Orientation:  Full (Time, Place, and Person)  Thought Content:  WDL  Suicidal Thoughts:  Yes.  with intent/plan  Homicidal Thoughts:  No  Memory:  Immediate;   Fair Recent;   Fair Remote;   Fair  Judgement:  Impaired  Insight:  Shallow  Psychomotor Activity:  Normal  Concentration:  Concentration: Fair and Attention Span: Fair  Recall:  [employer] of Knowledge:  Fair  Language:  Fair  Akathisia:  No  Handed:  Right  AIMS (if indicated):     Assets:  Communication Skills Desire for Improvement Financial Resources/Insurance Physical Health Resilience Social Support  ADL's:  Intact  Cognition:  WNL  Sleep:  Number of Hours: 5.3      COGNITIVE FEATURES THAT CONTRIBUTE TO RISK:  None    SUICIDE RISK:   Moderate:  Frequent suicidal ideation with limited intensity, and duration, some specificity in terms of plans, no associated intent, good self-control, limited dysphoria/symptomatology, some risk factors  present, and identifiable protective factors, including  available and accessible social support.   PLAN OF CARE: Hospital admission, medication management, substance abuse counseling, discharge planning.  Ms. Loleta ChanceHill is a 45 year old female with a history of depression, mood instability and substance abuse admitted for worsening of depression and suicidal ideation in the context of medication noncompliance and relapse on drugs.   1. Suicidal ideation. The patient is able to contract for safety in the hospital.  2. Mood. She was restarted on Seroquel for depression and mood stabilization. Will increase to her regular dose 100 mg in the morning 200 at bedtime.  3. Alcohol dependence. She was started on Librium taper.  4. Opiate dependence. She relapsed on heroin 5 days ago. Will give a brief Suboxone treatment.   5. Seizure disorder. She was started on Dilantin and high dose Neurontin.  6. Smoking. Nicotine patch is available.  7. Substance abuse treatment. The patient declined residential treatment but will like to resume with Suboxone clinic and AA meetings.   8. Disposition. She will be discharged to home with family. She will follow up with [facility].    I certify that inpatient services furnished can reasonably be expected to improve the patient's condition.  Kristine LineaJolanta Desean Heemstra, MD 11/29/2015, 8:17 AM
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
## 2015-11-29 NOTE — [facility] Group Notes (Signed)
[facility] LCSW Group Therapy   11/29/2015 9:30am  Type of Therapy: Group Therapy   Participation Level: Invited but did not attend. Participation Quality: Invited but did not attend.    Telford Archambeau, MSW, LCSWA   

## 2015-11-29 NOTE — Tx Team (Signed)
Interdisciplinary Treatment and Diagnostic Plan Update  11/29/2015 Time of Session:2:00pm Megan LarssonShenna M Shaffer MRN: [id number]  Principal Diagnosis: Bipolar 2 disorder, major depressive episode (HCC)  Secondary Diagnoses: Principal Problem:   Bipolar 2 disorder, major depressive episode (HCC) Active Problems:   Alcohol use disorder, moderate, dependence (HCC)   Seizure disorder (HCC)   Cocaine use disorder, moderate, dependence (HCC)   Substance induced mood disorder (HCC)   Alcohol hallucinosis (HCC)   Tobacco use disorder   Opioid use disorder, moderate, dependence (HCC)   Current Medications:  Current Facility-Administered Medications  Medication Dose Route Frequency Provider Last Rate Last Dose  . acetaminophen (TYLENOL) tablet 650 mg  650 mg Oral Q6H PRN Audery AmelJohn T Clapacs, MD   650 mg at 11/28/15 2120  . alum & mag hydroxide-simeth (MAALOX/MYLANTA) 200-200-20 MG/5ML suspension 30 mL  30 mL Oral Q4H PRN Audery AmelJohn T Clapacs, MD      . buprenorphine (SUBUTEX) SL tablet 4 mg  4 mg Sublingual Daily Jolanta B Pucilowska, MD   4 mg at 11/29/15 0847  . cephALEXin (KEFLEX) capsule 500 mg  500 mg Oral Q8H Jolanta B Pucilowska, MD   500 mg at 11/29/15 1652  . chlordiazePOXIDE (LIBRIUM) capsule 10 mg  10 mg Oral QID Shari ProwsJolanta B Pucilowska, MD   10 mg at 11/29/15 1647  . folic acid (FOLVITE) tablet 1 mg  1 mg Oral Daily Audery AmelJohn T Clapacs, MD   1 mg at 11/29/15 0831  . gabapentin (NEURONTIN) capsule 1,800 mg  1,800 mg Oral BID Audery AmelJohn T Clapacs, MD   1,800 mg at 11/29/15 1647  . magnesium hydroxide (MILK OF MAGNESIA) suspension 30 mL  30 mL Oral Daily PRN Audery AmelJohn T Clapacs, MD      . multivitamin with minerals tablet 1 tablet  1 tablet Oral Daily Audery AmelJohn T Clapacs, MD   1 tablet at 11/29/15 0831  . nicotine (NICODERM CQ - dosed in mg/24 hours) patch 21 mg  21 mg Transdermal Daily Audery AmelJohn T Clapacs, MD   21 mg at 11/29/15 0836  . phenytoin (DILANTIN) ER capsule 300 mg  300 mg Oral QHS Audery AmelJohn T Clapacs, MD   300 mg at 11/28/15  2133  . QUEtiapine (SEROQUEL) tablet 300 mg  300 mg Oral QHS Jolanta B Pucilowska, MD      . thiamine (VITAMIN B-1) tablet 100 mg  100 mg Oral Daily Audery AmelJohn T Clapacs, MD   100 mg at 11/29/15 0830   Or  . thiamine (B-1) injection 100 mg  100 mg Intravenous Daily Audery AmelJohn T Clapacs, MD       PTA Medications: Prescriptions Prior to Admission  Medication Sig Dispense Refill Last Dose  . ALPRAZolam (XANAX) 1 MG tablet Take 1 mg by mouth 3 (three) times daily as needed for anxiety.     . diazepam (VALIUM) 10 MG tablet Take 10 mg by mouth 3 (three) times daily as needed for anxiety (seizures).     . gabapentin (NEURONTIN) 600 MG tablet Take 1,800 mg by mouth 2 (two) times daily.     . phenytoin (DILANTIN) 100 MG ER capsule Take 300 mg by mouth at bedtime.        Treatment Modalities: Medication Management, Group therapy, Case management,  1 to 1 session with clinician, Psychoeducation, Recreational therapy.   Physician Treatment Plan for Primary Diagnosis: Bipolar 2 disorder, major depressive episode (HCC) Long Term Goal(s): Improvement in symptoms so as ready for discharge   Short Term Goals: Ability to identify changes in lifestyle  to reduce recurrence of condition will improve, Ability to demonstrate self-control will improve, Ability to identify and develop effective coping behaviors will improve, Ability to maintain clinical measurements within normal limits will improve, Compliance with prescribed medications will improve and Ability to identify triggers associated with substance abuse/mental health issues will improve  Medication Management: Evaluate patient's response, side effects, and tolerance of medication regimen.  Therapeutic Interventions: 1 to 1 sessions, Unit Group sessions and Medication administration.  Evaluation of Outcomes: Progressing  Physician Treatment Plan for Secondary Diagnosis: Principal Problem:   Bipolar 2 disorder, major depressive episode (HCC) Active Problems:    Alcohol use disorder, moderate, dependence (HCC)   Seizure disorder (HCC)   Cocaine use disorder, moderate, dependence (HCC)   Substance induced mood disorder (HCC)   Alcohol hallucinosis (HCC)   Tobacco use disorder   Opioid use disorder, moderate, dependence (HCC)  Long Term Goal(s): Improvement in symptoms so as ready for discharge  Short Term Goals: Ability to identify changes in lifestyle to reduce recurrence of condition will improve, Ability to demonstrate self-control will improve, Ability to identify and develop effective coping behaviors will improve, Ability to maintain clinical measurements within normal limits will improve, Compliance with prescribed medications will improve and Ability to identify triggers associated with substance abuse/mental health issues will improve  Medication Management: Evaluate patient's response, side effects, and tolerance of medication regimen.  Therapeutic Interventions: 1 to 1 sessions, Unit Group sessions and Medication administration.  Evaluation of Outcomes: Progressing   RN Treatment Plan for Primary Diagnosis: Bipolar 2 disorder, major depressive episode (HCC) Long Term Goal(s): Knowledge of disease and therapeutic regimen to maintain health will improve  Short Term Goals: Ability to verbalize frustration and anger appropriately will improve, Ability to demonstrate self-control and Ability to participate in decision making will improve  Medication Management: RN will administer medications as ordered by provider, will assess and evaluate patient's response and provide education to patient for prescribed medication. RN will report any adverse and/or side effects to prescribing provider.  Therapeutic Interventions: 1 on 1 counseling sessions, Psychoeducation, Medication administration, Evaluate responses to treatment, Monitor vital signs and CBGs as ordered, Perform/monitor CIWA, COWS, AIMS and Fall Risk screenings as ordered, Perform wound  care treatments as ordered.  Evaluation of Outcomes: Progressing   LCSW Treatment Plan for Primary Diagnosis: Bipolar 2 disorder, major depressive episode (HCC) Long Term Goal(s): Safe transition to appropriate next level of care at discharge, Engage patient in therapeutic group addressing interpersonal concerns.  Short Term Goals: Engage patient in aftercare planning with referrals and resources, Increase emotional regulation, Facilitate acceptance of mental health diagnosis and concerns, Facilitate patient progression through stages of change regarding substance use diagnoses and concerns, Identify triggers associated with mental health/substance abuse issues and Increase skills for wellness and recovery  Therapeutic Interventions: Assess for all discharge needs, 1 to 1 time with Social worker, Explore available resources and support systems, Assess for adequacy in community support network, Educate family and significant other(s) on suicide prevention, Complete Psychosocial Assessment, Interpersonal group therapy.  Evaluation of Outcomes: Progressing   Progress in Treatment: Attending groups: Yes. Participating in groups: Yes. Taking medication as prescribed: Yes. Toleration medication: Yes. Family/Significant other contact made: No, will contact:  Pt REFUSED Patient understands diagnosis: Yes. Discussing patient identified problems/goals with staff: Yes. Medical problems stabilized or resolved: Yes. Denies suicidal/homicidal ideation: No. Issues/concerns per patient self-inventory: No. Other:    New problem(s) identified: No, Describe:     New Short Term/Long Term Goal(s):  Discharge Plan or Barriers: Discharge home with friend or shelter and follow up with [facility] program  Reason for Continuation of Hospitalization: Anxiety Depression Medication stabilization Withdrawal symptoms  Estimated Length of Stay: 2-3 days  Attendees: Patient:Megan Shaffer 11/29/2015 5:28  PM  Physician: Kristine Linea, MD 11/29/2015 5:28 PM  Nursing: Hulan Amato, RN 11/29/2015 5:28 PM  RN Care Manager: 11/29/2015 5:28 PM  Social Worker: Jake Shark, LCSW 11/29/2015 5:28 PM  Recreational Therapist:  11/29/2015 5:28 PM  Other:  11/29/2015 5:28 PM  Other:  11/29/2015 5:28 PM  Other: 11/29/2015 5:28 PM    Scribe for Treatment Team: Glennon Mac, LCSW 11/29/2015 5:28 PM

## 2015-11-29 NOTE — Plan of Care (Signed)
Problem: Medication: Goal: Compliance with prescribed medication regimen will improve Outcome: Progressing Compliant with medication perscribed

## 2015-11-29 NOTE — Progress Notes (Signed)
D: Patient stated slept poor last night .Stated appetitefair and energy level  Is normal. Stated concentration is good . Stated on Depression scale  5, hopeless 8 and anxiety 7 .( low 0-10 high) Denies suicidal  homicidal ideations  .  No auditory hallucinations  No pain concerns . Appropriate ADL'S. Interacting with peers and staff. Voice of going through withdrawal sinus drainage and  Voice of craving A: Encourage patient participation with unit programming . Instruction  Given on  Medication , verbalize understanding. R: Voice no other concerns. Staff continue to monitor

## 2015-11-29 NOTE — [facility] Group Notes (Addendum)
Goals Group  Date/Time: 9:00AM Type of Therapy and Topic: Group Therapy: Goals Group: SMART Goals  ?  Participation Level: Moderate  ?  Description of Group:  ?  The purpose of a daily goals group is to assist and guide patients in setting recovery/wellness-related goals. The objective is to set goals as they relate to the crisis in which they were admitted. Patients will be using SMART goal modalities to set measurable goals. Characteristics of realistic goals will be discussed and patients will be assisted in setting and processing how one will reach their goal. Facilitator will also assist patients in applying interventions and coping skills learned in psycho-education groups to the SMART goal and process how one will achieve defined goal.  ?  Therapeutic Goals:  ?  -Patients will develop and document one goal related to or their crisis in which brought them into treatment.  -Patients will be guided by LCSW using SMART goal setting modality in how to set a measurable, attainable, realistic and time sensitive goal.  -Patients will process barriers in reaching goal.  -Patients will process interventions in how to overcome and successful in reaching goal.  ?  Patient's Goal: Patient invited but did not attend. ?  Therapeutic Modalities:  Motivational Interviewing  Cognitive Behavioral Therapy  Crisis Intervention Model  SMART goals setting  Lynden OxfordKadijah R. Anyela Napierkowski, MSW, LCSW-A 11/29/2015, 10:18AM

## 2015-11-29 NOTE — H&P (Signed)
Psychiatric Admission Assessment Adult  Patient Identification: Megan Shaffer MRN:  [id number] Date of Evaluation:  11/29/2015 Chief Complaint:  Depression Principal Diagnosis: Bipolar 2 disorder, major depressive episode ([place]) Diagnosis:   Patient Active Problem List   Diagnosis Date Noted  . Bipolar 2 disorder, major depressive episode ([place]) [F31.81] 11/29/2015  . Opioid use disorder, moderate, dependence ([place]) [F11.20] 11/29/2015  . Alcohol use disorder, moderate, dependence ([place]) [F10.20] 11/28/2015  . Seizure disorder ([place]) [W09.811] 11/28/2015  . Cocaine use disorder, moderate, dependence ([place]) [F14.20] 11/28/2015  . Substance induced mood disorder ([place]) [F19.94] 11/28/2015  . 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 Tobacco use disorder [F17.200] 11/28/2015   History of Present Illness:   Identifying data. Megan Shaffer is a 45 year old female, depression, mood instability, and substance abuse.  Chief complaint. "I'm so ashamed of myself."  History of present illness. Information was obtained from the patient and the chart. The patient has long history of bipolar illness and substance use including alcohol, cocaine and heroine. He had been clean of cocaine for the past 3 years while in Suboxone clinic in [facility] and taking Seroquel for bipolar disorder. She was recently released from jail after 120 days there. She was not given any medications jail. Following release she moved in with her in 5 days ago relapsed on heroin. There are multiple needle marks on her body. Without medication and the patient became increasingly depressed with poor sleep, decreased appetite, anhedonia, being of guilt and hopelessness worthlessness, poor energy and concentration, crying spells, social isolation. Following relapse she became even more depressed and ashamed of herself. She became suicidal with a plan to overdose. She denies psychotic symptoms but the diagnosis of alcohol hallucinosis was  given on admission. She also reports heightened anxiety with PTSD type symptoms from past abuse. She admits to drinking a bottle of liquor lately, using heroine and cocaine. She was brought to the hospital by her neighbor witnessed her having seizures. The patient has history of seizure disorder and has been maintained on a combination of Dilantin and Neurontin. She does not believe that her seizures are related to alcohol withdrawal. She does not have a driver's license because of seizures.  Past psychiatric history. She was hospitalized before for substance abuse as well as depression. 6 years ago she attempted suicide by medication overdose. She had been tried on multiple medications in the past but believes that Seroquel works best for her. She reports maintaining sobriety while on Suboxone.  Family psychiatric history. Multiple family members with depression and anxiety. Brother with heroine addiction.  Social history. She is disabled from bipolar disorder. She believes that she could go back with her aunt in [place] following discharge. She is not interested in residential substance abuse treatment and would like to follow up with Suboxone clinic and AA. As she denies any current legal charges.  Total Time spent with patient: 1 hour  Is the patient at risk to self? Yes.    Has the patient been a risk to self in the past 6 months? No.  Has the patient been a risk to self within the distant past? Yes.    Is the patient a risk to others? No.  Has the patient been a risk to others in the past 6 months? No.  Has the patient been a risk to others within the distant past? No.   Prior Inpatient Therapy:   Prior Outpatient Therapy:    Alcohol Screening: 1. How often do you  have a drink containing alcohol?: 4 or more times a week 2. How many drinks containing alcohol do you have on a typical day when you are drinking?: 7, 8, or 9 3. How often do you have six or more drinks on one occasion?: Daily  or almost daily Preliminary Score: 7 4. How often during the last year have you found that you were not able to stop drinking once you had started?: Monthly 5. How often during the last year have you failed to do what was normally expected from you becasue of drinking?: Weekly 6. How often during the last year have you needed a first drink in the morning to get yourself going after a heavy drinking session?: Weekly 7. How often during the last year have you had a feeling of guilt of remorse after drinking?: Weekly 8. How often during the last year have you been unable to remember what happened the night before because you had been drinking?: Never 9. Have you or someone else been injured as a result of your drinking?: No 10. Has a relative or friend or a doctor or another health worker been concerned about your drinking or suggested you cut down?: Yes, during the last year Alcohol Use Disorder Identification Test Final Score (AUDIT): 26 Brief Intervention: Yes Substance Abuse History in the last 12 months:  Yes.   Consequences of Substance Abuse: Negative Previous Psychotropic Medications: Yes  Psychological Evaluations: No  Past Medical History:  Past Medical History:  Diagnosis Date  . Hepatitis C   . Seizures ([place])     Past Surgical History:  Procedure Laterality Date  . ABDOMINAL SURGERY    . CHOLECYSTECTOMY    . TONSILLECTOMY    . TUBAL LIGATION     Family History:  Family History  Problem Relation Age of Onset  . Cancer Mother    Tobacco Screening: Have you used any form of tobacco in the last 30 days? (Cigarettes, Smokeless Tobacco, Cigars, and/or Pipes): Yes Tobacco use, Select all that apply: 5 or more cigarettes per day Are you interested in Tobacco Cessation Medications?: No, patient refused Counseled patient on smoking cessation including recognizing danger situations, developing coping skills and basic information about quitting provided: Yes Social History:   History  Alcohol Use  . Yes    Comment: Fifth of liquor daily      History  Drug Use  . Types: IV, Cocaine, Heroin    Comment: also Heroin    Additional Social History:      Prescriptions: xanax, valium per pt History of alcohol / drug use?: Yes Negative Consequences of Use: Financial, Legal, Personal relationships Withdrawal Symptoms: Seizures, Irritability, Nausea / Vomiting, Tremors, Sweats Onset of Seizures: past year per pt Date of most recent seizure: 11/27/2015 Name of Substance 1: alcohol 1 - Amount (size/oz): 5th of liquor daily 1 - Frequency: daily  1 - Last Use / Amount: 11/27/2015 Name of Substance 2: cociane  2 - Last Use / Amount: 11/27/2015                Allergies:   Allergies  Allergen Reactions  . Ciprofloxacin Other (See Comments)    Unknown reaction  . Famotidine Other (See Comments)    Unknown reaction.  Lindajo [place] [Ziprasidone Hcl] Other (See Comments)    Unknown reaction  . Imitrex [Sumatriptan] Other (See Comments)    Unknown reaction  . Toradol [Ketorolac Tromethamine] Other (See Comments)    Unknown reaction  . Zofran [Ondansetron Hcl]  Other (See Comments)    Unknown reaction   Lab Results:  Results for orders placed or performed during the hospital encounter of 11/27/15 (from the past 48 hour(s))  Ethanol     Status: Abnormal   Collection Time: 11/27/15  4:02 PM  Result Value Ref Range   Alcohol, Ethyl (B) 23 (H) <5 mg/dL    Comment:        LOWEST DETECTABLE LIMIT FOR SERUM ALCOHOL IS 5 mg/dL FOR MEDICAL PURPOSES ONLY   Urine Drug Screen, Qualitative     Status: Abnormal   Collection Time: 11/27/15  4:02 PM  Result Value Ref Range   Tricyclic, Ur Screen NONE DETECTED NONE DETECTED   Amphetamines, Ur Screen NONE DETECTED NONE DETECTED   MDMA (Ecstasy)Ur Screen NONE DETECTED NONE DETECTED   Cocaine Metabolite,Ur [place] POSITIVE (A) NONE DETECTED   Opiate, Ur Screen NONE DETECTED NONE DETECTED   Phencyclidine (PCP) Ur S NONE DETECTED  NONE DETECTED   Cannabinoid 50 Ng, Ur [place] NONE DETECTED NONE DETECTED   Barbiturates, Ur Screen NONE DETECTED NONE DETECTED   Benzodiazepine, Ur Scrn NONE DETECTED NONE DETECTED   Methadone Scn, Ur NONE DETECTED NONE DETECTED    Comment: (NOTE) 638  Tricyclics, urine               Cutoff 1000 ng/mL 200  Amphetamines, urine             Cutoff 1000 ng/mL 300  MDMA (Ecstasy), urine           Cutoff 500 ng/mL 400  Cocaine Metabolite, urine       Cutoff 300 ng/mL 500  Opiate, urine                   Cutoff 300 ng/mL 600  Phencyclidine (PCP), urine      Cutoff 25 ng/mL 700  Cannabinoid, urine              Cutoff 50 ng/mL 800  Barbiturates, urine             Cutoff 200 ng/mL 900  Benzodiazepine, urine           Cutoff 200 ng/mL 1000 Methadone, urine                Cutoff 300 ng/mL 1100 1200 The urine drug screen provides only a preliminary, unconfirmed 1300 analytical test result and should not be used for non-medical 1400 purposes. Clinical consideration and professional judgment should 1500 be applied to any positive drug screen result due to possible 1600 interfering substances. A more specific alternate chemical method 1700 must be used in order to obtain a confirmed analytical result.  1800 Gas chromato graphy / mass spectrometry (GC/MS) is the preferred 1900 confirmatory method.   Pregnancy, urine POC     Status: None   Collection Time: 11/27/15  4:08 PM  Result Value Ref Range   Preg Test, Ur NEGATIVE NEGATIVE    Comment:        THE SENSITIVITY OF THIS METHODOLOGY IS >24 mIU/mL   CBC     Status: Abnormal   Collection Time: 11/27/15  4:32 PM  Result Value Ref Range   WBC 13.5 (H) 3.6 - 11.0 K/uL   RBC 5.04 3.80 - 5.20 MIL/uL   Hemoglobin 14.4 12.0 - 16.0 g/dL   HCT 41.1 35.0 - 47.0 %   MCV 81.6 80.0 - 100.0 fL   MCH 28.6 26.0 - 34.0 pg   MCHC 35.0 32.0 -  36.0 g/dL   RDW 12.9 11.5 - 14.5 %   Platelets 354 150 - 440 K/uL  Comprehensive metabolic panel     Status: Abnormal    Collection Time: 11/27/15  4:32 PM  Result Value Ref Range   Sodium 138 135 - 145 mmol/L   Potassium 3.4 (L) 3.5 - 5.1 mmol/L   Chloride 105 101 - 111 mmol/L   CO2 24 22 - 32 mmol/L   Glucose, Bld 102 (H) 65 - 99 mg/dL   BUN <5 (L) 6 - 20 mg/dL   Creatinine, Ser 0.61 0.44 - 1.00 mg/dL   Calcium 8.9 8.9 - 10.3 mg/dL   Total Protein 7.0 6.5 - 8.1 g/dL   Albumin 3.8 3.5 - 5.0 g/dL   AST 32 15 - 41 U/L   ALT 24 14 - 54 U/L   Alkaline Phosphatase 84 38 - 126 U/L   Total Bilirubin 0.3 0.3 - 1.2 mg/dL   GFR calc non Af Amer >60 >60 mL/min   GFR calc Af Amer >60 >60 mL/min    Comment: (NOTE) The eGFR has been calculated using the CKD EPI equation. This calculation has not been validated in all clinical situations. eGFR's persistently <60 mL/min signify possible Chronic Kidney Disease.    Anion gap 9 5 - 15  Acetaminophen level     Status: Abnormal   Collection Time: 11/27/15  4:32 PM  Result Value Ref Range   Acetaminophen (Tylenol), Serum <10 (L) 10 - 30 ug/mL    Comment:        THERAPEUTIC CONCENTRATIONS VARY SIGNIFICANTLY. A RANGE OF 10-30 ug/mL MAY BE AN EFFECTIVE CONCENTRATION FOR MANY PATIENTS. HOWEVER, SOME ARE BEST TREATED AT CONCENTRATIONS OUTSIDE THIS RANGE. ACETAMINOPHEN CONCENTRATIONS >150 ug/mL AT 4 HOURS AFTER INGESTION AND >50 ug/mL AT 12 HOURS AFTER INGESTION ARE OFTEN ASSOCIATED WITH TOXIC REACTIONS.   Salicylate level     Status: None   Collection Time: 11/27/15  4:32 PM  Result Value Ref Range   Salicylate Lvl <1.6 2.8 - 30.0 mg/dL    Blood Alcohol level:  Lab Results  Component Value Date   ETH 23 (H) 11/27/2015   ETH  07/30/2008    <5        LOWEST DETECTABLE LIMIT FOR SERUM ALCOHOL IS 5 mg/dL FOR MEDICAL PURPOSES ONLY    Metabolic Disorder Labs:  No results found for: HGBA1C, MPG No results found for: PROLACTIN No results found for: CHOL, TRIG, HDL, CHOLHDL, VLDL, LDLCALC  Current Medications: Current Facility-Administered  Medications  Medication Dose Route Frequency Provider Last Rate Last Dose  . acetaminophen (TYLENOL) tablet 650 mg  650 mg Oral Q6H PRN Gonzella Lex, MD   650 mg at 11/28/15 2120  . alum & mag hydroxide-simeth (MAALOX/MYLANTA) 200-200-20 MG/5ML suspension 30 mL  30 mL Oral Q4H PRN Gonzella Lex, MD      . buprenorphine (SUBUTEX) SL tablet 4 mg  4 mg Sublingual Daily  B , MD      . chlordiazePOXIDE (LIBRIUM) capsule 25 mg  25 mg Oral Once Gonzella Lex, MD      . folic acid (FOLVITE) tablet 1 mg  1 mg Oral Daily Gonzella Lex, MD      . gabapentin (NEURONTIN) capsule 1,800 mg  1,800 mg Oral BID Gonzella Lex, MD   1,800 mg at 11/28/15 1838  . LORazepam (ATIVAN) tablet 1 mg  1 mg Oral Q6H PRN Gonzella Lex, MD   1 mg at  11/29/15 0009   Or  . LORazepam (ATIVAN) injection 1 mg  1 mg Intravenous Q6H PRN Gonzella Lex, MD      . magnesium hydroxide (MILK OF MAGNESIA) suspension 30 mL  30 mL Oral Daily PRN Gonzella Lex, MD      . multivitamin with minerals tablet 1 tablet  1 tablet Oral Daily John T Clapacs, MD      . nicotine (NICODERM CQ - dosed in mg/24 hours) patch 21 mg  21 mg Transdermal Daily Gonzella Lex, MD   21 mg at 11/28/15 1844  . phenytoin (DILANTIN) ER capsule 300 mg  300 mg Oral QHS Gonzella Lex, MD   300 mg at 11/28/15 2133  . QUEtiapine (SEROQUEL) tablet 100 mg  100 mg Oral Daily Allexus Ovens B Quenten Nawaz, MD      . QUEtiapine (SEROQUEL) tablet 200 mg  200 mg Oral QHS Dontrae Morini B Mardell Suttles, MD      . thiamine (VITAMIN B-1) tablet 100 mg  100 mg Oral Daily Gonzella Lex, MD       Or  . thiamine (B-1) injection 100 mg  100 mg Intravenous Daily Gonzella Lex, MD       PTA Medications: Prescriptions Prior to Admission  Medication Sig Dispense Refill Last Dose  . ALPRAZolam (XANAX) 1 MG tablet Take 1 mg by mouth 3 (three) times daily as needed for anxiety.     . diazepam (VALIUM) 10 MG tablet Take 10 mg by mouth 3 (three) times daily as needed for anxiety  (seizures).     . gabapentin (NEURONTIN) 600 MG tablet Take 1,800 mg by mouth 2 (two) times daily.     . phenytoin (DILANTIN) 100 MG ER capsule Take 300 mg by mouth at bedtime.        Musculoskeletal: Strength & Muscle Tone: within normal limits Gait & Station: normal Patient leans: N/A  Psychiatric Specialty Exam: Physical Exam  Nursing note and vitals reviewed. Constitutional: She is oriented to person, place, and time. She appears well-developed and well-nourished.  HENT:  Head: Normocephalic and atraumatic.  Eyes: Conjunctivae and EOM are normal. Pupils are equal, round, and reactive to light.  Neck: Normal range of motion. Neck supple.  Cardiovascular: Normal rate, regular rhythm and normal heart sounds.   Respiratory: Effort normal and breath sounds normal.  GI: Soft. Bowel sounds are normal.  Musculoskeletal: Normal range of motion.  Neurological: She is alert and oriented to person, place, and time.  Skin: Skin is warm and dry.    Review of Systems  Gastrointestinal: Positive for nausea.  Musculoskeletal: Positive for myalgias.  Psychiatric/Behavioral: Positive for depression, substance abuse and suicidal ideas. The patient is nervous/anxious and has insomnia.   All other systems reviewed and are negative.   Blood pressure 126/76, pulse (!) 108, temperature 98.7 F (37.1 C), temperature source Oral, resp. rate 20, height 5' 7" (1.702 m), weight 68.9 kg (152 lb), last menstrual period 11/20/2015, SpO2 99 %.Body mass index is 23.81 kg/m.  See SRA.                                                  Sleep:  Number of Hours: 5.3    Treatment Plan Summary: Daily contact with patient to assess and evaluate symptoms and progress in treatment and Medication management   Ms.  Yust is a 45 year old female with a history of depression, mood instability and substance abuse admitted for worsening of depression and suicidal ideation in the context of medication  noncompliance and relapse on drugs.   1. Suicidal ideation. The patient is able to contract for safety in the hospital.  2. Mood. She was restarted on Seroquel for depression and mood stabilization. Will increase to her regular dose 100 mg in the morning 200 at bedtime.  3. Alcohol dependence. She was started on Librium taper.  4. Opiate dependence. She relapsed on heroin 5 days ago. Will give a brief Suboxone treatment.   5. Seizure disorder. She was started on Dilantin and high dose Neurontin.  6. Smoking. Nicotine patch is available.  7. Substance abuse treatment. The patient declined residential treatment but will like to resume with Suboxone clinic and [place] meetings.   8. Metabolic syndrome monitoring. Lipid profile and TSH are normal. Hemoglobin A1c are pending.   9. EKG. Normal sinus rhythm. QT 390.  10. Disposition. She will be discharged to home with family. She will follow up with [facility].    Observation Level/Precautions:  15 minute checks  Laboratory:  CBC Chemistry Profile UDS UA  Psychotherapy:    Medications:    Consultations:    Discharge Concerns:    Estimated LOS:  Other:     Physician Treatment Plan for Primary Diagnosis: Bipolar 2 disorder, major depressive episode ([place]) Long Term Goal(s): Improvement in symptoms so as ready for discharge  Short Term Goals: Ability to identify changes in lifestyle to reduce recurrence of condition will improve, Ability to demonstrate self-control will improve, Ability to identify and develop effective coping behaviors will improve, Ability to maintain clinical measurements within normal limits will improve, Compliance with prescribed medications will improve and Ability to identify triggers associated with substance abuse/mental health issues will improve  Physician Treatment Plan for Secondary Diagnosis: Principal Problem:   Bipolar 2 disorder, major depressive episode ([place]) Active Problems:   Alcohol use  disorder, moderate, dependence ([facility])   Seizure disorder ([facility])   Cocaine use disorder, moderate, dependence ([place])   Substance induced mood disorder ([place])   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    Orson Slick, MD 9/5/20178:25 AM

## 2015-11-30 NOTE — Progress Notes (Signed)
A&Ox3, observed in the Day Room eating from the condiments bar; grape jelly, peanut butter and cheese, "I am hungry, I just want to eat everything I can find .Marland Kitchen..." Denied SI/HI, denied AV/H; will continue to monitor.

## 2015-11-30 NOTE — [facility] Group Notes (Signed)
[facility] Group Notes:  (Nursing/MHT/Case Management/Adjunct)  Date:  11/30/2015  Time:  3:54 PM  Type of Therapy:  Psychoeducational Skills  Participation Level:  Active  Participation Quality:  Appropriate, Attentive and Sharing  Affect:  Appropriate  Cognitive:  Alert and Appropriate  Insight:  Appropriate  Engagement in Group:  Engaged  Modes of Intervention:  Discussion, Education and Support  Summary of Progress/Problems:  Lynelle SmokeCara Travis Lo[facility] Womens HospitalMadoni 11/30/2015, 3:54 PM

## 2015-11-30 NOTE — Progress Notes (Signed)
Recreation Therapy Notes  Date: 09.06.17 Time: 1:00 pm Location: Craft Room  Group Topic: Self-esteem  Goal Area(s) Addresses:  Patient will write at least one positive trait about self. Patient will verbalize benefit of having a healthy self-esteem.  Behavioral Response: Attentive, Interactive, Intermittently Disruptive  Intervention: I Am  Activity: Patients were given worksheets with the letter I on it and instructed to write as many positive traits inside the letter.  Education: LRT educated patients on ways they can increase their self-esteem.  Education Outcome: Acknowledges education/In group clarification offered   Clinical Observations/Feedback: Patient completed activity by writing positive traits about herself. Patient contributed to group discussion by stating what makes it difficult to think of positive traits, and how she can increase her self-esteem. Patient would laugh during group discussion. LRT redirected patient and patient complied.  Jacquelynn CreeGreene,Brahm Barbeau M, LRT/CTRS 11/30/2015 2:46 PM

## 2015-11-30 NOTE — Progress Notes (Signed)
D: Patient stated slept good last night .Stated appetite is fair and energy level  Is normal. Stated concentration is good . Stated on Depression scale  6, hopeless 8 and anxiety 6 .( low 0-10 high) Denies suicidal  homicidal ideations  .  No auditory hallucinations  No pain concerns . Appropriate ADL'S. Interacting with peers and staff. Goal today " Me getting  Better" " What ever means  Necessary " Continue to voice of kidney infection Instruction on  Medications  A: Encourage patient participation with unit programming . Instruction  Given on  Medication , verbalize understanding. R: Voice no other concerns. Staff continue to monitor

## 2015-11-30 NOTE — Progress Notes (Signed)
[facility] MD Progress Note  11/30/2015 3:54 PM Megan Shaffer  MRN:  [id number]  Subjective:  In spite the treatment with Suboxone and Librium the patient experiences mild symptoms of withdrawal. She also has cravings. She is very anxious today, depressed and suicidal as we realize that she has Medicaid from [facility]. It is unlikely that she will be able to get services at the Suboxone clinic in our area. She tolerates medications well. Good group participation.  Principal Problem: Bipolar 2 disorder, major depressive episode (HCC) Diagnosis:   Patient Active Problem List   Diagnosis Date Noted  . Bipolar 2 disorder, major depressive episode (HCC) [F31.81] 11/29/2015  . Opioid use disorder, moderate, dependence (HCC) [F11.20] 11/29/2015  . Alcohol use disorder, moderate, dependence (HCC) [F10.20] 11/28/2015  . Seizure disorder (HCC) [G40.909] 11/28/2015  . Cocaine use disorder, moderate, dependence (HCC) [F14.20] 11/28/2015  . Substance induced mood disorder (HCC) [F19.94] 11/28/2015  . 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 Tobacco use disorder [F17.200] 11/28/2015   Total Time spent with patient: 20 minutes  Past Psychiatric History: depression, substance abuse.  Past Medical History:  Past Medical History:  Diagnosis Date  . Hepatitis C   . Seizures (HCC)     Past Surgical History:  Procedure Laterality Date  . ABDOMINAL SURGERY    . CHOLECYSTECTOMY    . TONSILLECTOMY    . TUBAL LIGATION     Family History:  Family History  Problem Relation Age of Onset  . Cancer Mother    Family Psychiatric  History: See H&P. Social History:  History  Alcohol Use  . Yes    Comment: Fifth of liquor daily      History  Drug Use  . Types: IV, Cocaine, Heroin    Comment: also Heroin    Social History   Social History  . Marital status: Divorced    Spouse name: N/A  . Number of children: N/A  . Years of education: N/A   Social History Main Topics  . Smoking  status: Current Every Day Smoker    Packs/day: 1.50    Types: Cigarettes  . Smokeless tobacco: Never Used  . Alcohol use Yes     Comment: Fifth of liquor daily   . Drug use:     Types: IV, Cocaine, Heroin     Comment: also Heroin  . Sexual activity: Not Asked   Other Topics Concern  . None   Social History Narrative  . None   Additional Social History:    Prescriptions: xanax, valium per pt History of alcohol / drug use?: Yes Negative Consequences of Use: Financial, Legal, Personal relationships Withdrawal Symptoms: Seizures, Irritability, Nausea / Vomiting, Tremors, Sweats Onset of Seizures: past year per pt Date of most recent seizure: 11/27/2015 Name of Substance 1: alcohol 1 - Amount (size/oz): 5th of liquor daily 1 - Frequency: daily  1 - Last Use / Amount: 11/27/2015 Name of Substance 2: cociane  2 - Last Use / Amount: 11/27/2015                Sleep: Fair  Appetite:  Fair  Current Medications: Current Facility-Administered Medications  Medication Dose Route Frequency Provider Last Rate Last Dose  . acetaminophen (TYLENOL) tablet 650 mg  650 mg Oral Q6H PRN Audery Amel, MD   650 mg at 11/28/15 2120  . alum & mag hydroxide-simeth (MAALOX/MYLANTA) 200-200-20 MG/5ML suspension 30 mL  30 mL Oral Q4H PRN Audery Amel, MD      .  buprenorphine (SUBUTEX) SL tablet 4 mg  4 mg Sublingual Daily Shari Prows, MD   4 mg at 11/30/15 0807  . cephALEXin (KEFLEX) capsule 500 mg  500 mg Oral Q8H Rondle Lohse B Vang Kraeger, MD   500 mg at 11/30/15 1413  . chlordiazePOXIDE (LIBRIUM) capsule 10 mg  10 mg Oral QID Shari Prows, MD   10 mg at 11/30/15 1152  . folic acid (FOLVITE) tablet 1 mg  1 mg Oral Daily Audery Amel, MD   1 mg at 11/30/15 0807  . gabapentin (NEURONTIN) capsule 1,800 mg  1,800 mg Oral BID Audery Amel, MD   1,800 mg at 11/30/15 0806  . magnesium hydroxide (MILK OF MAGNESIA) suspension 30 mL  30 mL Oral Daily PRN Audery Amel, MD      .  multivitamin with minerals tablet 1 tablet  1 tablet Oral Daily Audery Amel, MD   1 tablet at 11/30/15 0806  . nicotine (NICODERM CQ - dosed in mg/24 hours) patch 21 mg  21 mg Transdermal Daily Audery Amel, MD   21 mg at 11/30/15 0811  . phenytoin (DILANTIN) ER capsule 300 mg  300 mg Oral QHS Audery Amel, MD   300 mg at 11/29/15 2209  . QUEtiapine (SEROQUEL) tablet 300 mg  300 mg Oral QHS Shari Prows, MD   300 mg at 11/29/15 2209  . thiamine (VITAMIN B-1) tablet 100 mg  100 mg Oral Daily Audery Amel, MD   100 mg at 11/30/15 2956   Or  . thiamine (B-1) injection 100 mg  100 mg Intravenous Daily Audery Amel, MD        Lab Results:  Results for orders placed or performed during the hospital encounter of 11/28/15 (from the past 48 hour(s))  Hemoglobin A1c     Status: None   Collection Time: 11/29/15  7:11 AM  Result Value Ref Range   Hgb A1c MFr Bld 5.1 4.0 - 6.0 %  Lipid panel     Status: Abnormal   Collection Time: 11/29/15  7:11 AM  Result Value Ref Range   Cholesterol 120 0 - 200 mg/dL   Triglycerides 213 <086 mg/dL   HDL 37 (L) >57 mg/dL   Total CHOL/HDL Ratio 3.2 RATIO   VLDL 28 0 - 40 mg/dL   LDL Cholesterol 55 0 - 99 mg/dL    Comment:        Total Cholesterol/HDL:CHD Risk Coronary Heart Disease Risk Table                     Men   Women  1/2 Average Risk   3.4   3.3  Average Risk       5.0   4.4  2 X Average Risk   9.6   7.1  3 X Average Risk  23.4   11.0        Use the calculated Patient Ratio above and the CHD Risk Table to determine the patient's CHD Risk.        ATP III CLASSIFICATION (LDL):  <100     mg/dL   Optimal  846-962  mg/dL   Near or Above                    Optimal  130-159  mg/dL   Borderline  952-841  mg/dL   High  >324     mg/dL   Very High   TSH  Status: None   Collection Time: 11/29/15  7:11 AM  Result Value Ref Range   TSH 1.263 0.350 - 4.500 uIU/mL    Blood Alcohol level:  Lab Results  Component Value Date   ETH  23 (H) 11/27/2015   ETH  07/30/2008    <5        LOWEST DETECTABLE LIMIT FOR SERUM ALCOHOL IS 5 mg/dL FOR MEDICAL PURPOSES ONLY    Metabolic Disorder Labs: Lab Results  Component Value Date   HGBA1C 5.1 11/29/2015   No results found for: PROLACTIN Lab Results  Component Value Date   CHOL 120 11/29/2015   TRIG 141 11/29/2015   HDL 37 (L) 11/29/2015   CHOLHDL 3.2 11/29/2015   VLDL 28 11/29/2015   LDLCALC 55 11/29/2015    Physical Findings: AIMS: Facial and Oral Movements Muscles of Facial Expression: None, normal Lips and Perioral Area: None, normal Jaw: None, normal Tongue: None, normal,Extremity Movements Upper (arms, wrists, hands, fingers): None, normal Lower (legs, knees, ankles, toes): None, normal, Trunk Movements Neck, shoulders, hips: None, normal, Overall Severity Severity of abnormal movements (highest score from questions above): None, normal Incapacitation due to abnormal movements: None, normal Patient's awareness of abnormal movements (rate only patient's report): No Awareness, Dental Status Current problems with teeth and/or dentures?: No (dental caries, missing teeth) Does patient usually wear dentures?: No  CIWA:  CIWA-Ar Total: 0 COWS:     Musculoskeletal: Strength & Muscle Tone: within normal limits Gait & Station: normal Patient leans: N/A  Psychiatric Specialty Exam: Physical Exam  Nursing note and vitals reviewed.   Review of Systems  Gastrointestinal: Positive for nausea.  Neurological: Positive for tremors.  Psychiatric/Behavioral: Positive for depression, substance abuse and suicidal ideas. The patient is nervous/anxious.   All other systems reviewed and are negative.   Blood pressure 104/66, pulse 86, temperature 97.4 F (36.3 C), resp. rate 18, height 5\' 7"  (1.702 m), weight 68.9 kg (152 lb), last menstrual period 11/20/2015, SpO2 99 %.Body mass index is 23.81 kg/m.  General Appearance: Casual  Eye Contact:  Good  Speech:  Clear  and Coherent  Volume:  Normal  Mood:  Anxious and Depressed  Affect:  Appropriate  Thought Process:  Goal Directed  Orientation:  Full (Time, Place, and Person)  Thought Content:  WDL  Suicidal Thoughts:  Yes.  with intent/plan  Homicidal Thoughts:  No  Memory:  Immediate;   Fair Recent;   Fair Remote;   Fair  Judgement:  Impaired  Insight:  Lacking  Psychomotor Activity:  Normal  Concentration:  Concentration: Fair and Attention Span: Fair  Recall:  [employer] of Knowledge:  Fair  Language:  Fair  Akathisia:  No  Handed:  Right  AIMS (if indicated):     Assets:  Communication Skills Desire for Improvement Financial Resources/Insurance Physical Health Resilience Social Support  ADL's:  Intact  Cognition:  WNL  Sleep:  Number of Hours: 7.5     Treatment Plan Summary: Daily contact with patient to assess and evaluate symptoms and progress in treatment and Medication management   Ms. Quirk is a 45 year old female with a history of depression, mood instability and substance abuse admitted for worsening of depression and suicidal ideation in the context of medication noncompliance and relapse on drugs.   1. Suicidal ideation. The patient is able to contract for safety in the hospital.  2. Mood. She was restarted on Seroquel for depression and mood stabilization.   3. Alcohol dependence. She was started  on Librium taper.  4. Opiate dependence. She relapsed on heroin 5 days ago. Will give a brief Suboxone treatment.   5. Seizure disorder. She was started on Dilantin and high dose Neurontin.  6. Smoking. Nicotine patch is available.  7. Substance abuse treatment. The patient declined residential treatment but will like to resume with Suboxone clinic and AA meetings.   8. Metabolic syndrome monitoring. Lipid profile and TSH are normal. Hemoglobin A1c 5.1.   9. EKG. Normal sinus rhythm. QT 390.  10. Disposition. She will be discharged to home with family. She  will follow up with [facility].   Kristine LineaJolanta Laquentin Loudermilk, MD 11/30/2015, 3:54 PM

## 2015-11-30 NOTE — Plan of Care (Signed)
Problem: Coping: Goal: Ability to verbalize frustrations and anger appropriately will improve Outcome: Not Progressing Pt pacing hallway, very tensive. Told writer that if she didn't receive Ativan she was going to explode.

## 2015-11-30 NOTE — Progress Notes (Signed)
D: Observed pt pacing the hallway. Patient alert and oriented x4. Patient denies SI/HI/AVH. Pt affect is anxious and irritable. Pt told writer she was very anxious and was about to "blow-up." Pt rated anxiety 9/10. Pt indicated another pt made her very upset, and she was very agitated and couldn't control it.  A: Offered active listening and support. Provided therapeutic communication. Administered scheduled medications. Attempted to teach and encourage deep breathing and relaxation techniques. Dr. Ardyth HarpsHernandez contacted and writer administered a one time dose of Ativan 1 mg. Writer indicated to pt that the Ativan would be a single dose, and encouraged pt to work on managing anxiety and agitation through non-pharmacologic means. R: Pt cooperative. Pt later stated she felt "mellow" and rated anxiety 6/10. Pt medication compliant. Will continue Q15 min. checks. Safety maintained.

## 2015-11-30 NOTE — Plan of Care (Signed)
Problem: Safety: Goal: Ability to remain free from injury will improve Outcome: Progressing Patient able to alert  Staff of any concerns or safety  Needs

## 2015-11-30 NOTE — Progress Notes (Signed)
Recreation Therapy Notes  INPATIENT RECREATION THERAPY ASSESSMENT  Patient Details Name: Megan Shaffer MRN: [id number] DOB: 07/13/1970 Today's Date: 11/30/2015  Patient Stressors: Family, Death, Friends, Other (Comment) (Family is a trigger - aunt is an Scientist, forensicenabler and dad married a 3120 something year old and it makes pt mad; mom and uncle died recently; lack of supportive friends; everything)  Coping Skills:   Isolate, Arguments, Substance Abuse, Avoidance, Exercise, Art/Dance, Music, Sports  Personal Challenges: Anger, Communication, Concentration, Decision-Making, Problem-Solving, Relationships, Social Interaction, Stress Management, Substance Abuse, Time Management, Trusting Others  Leisure Interests (2+):  Nature - Therapist, musicishing, Individual - Other (Comment) Stage manager(Racing)  Awareness of Community Resources:  Yes  Community Resources:  Park, Other (Comment) ([place])  Current Use: Yes  If no, Barriers?:    Patient Strengths:  Hair, truthful  Patient Identified Areas of Improvement:  Belly  Current Recreation Participation:  Drinking and drugs  Patient Goal for Hospitalization:  Stop feeling suicidal  [place]idence[place]   Current SI (including self-harm):  No  Current HI:  No  Consent to Intern Participation: N/A   Jacquelynn CreeGreene,Tora Prunty M, LRT/CTRS 11/30/2015, 3:12 PM

## 2015-11-30 NOTE — [facility] Group Notes (Signed)
[facility] LCSW Group Therapy   11/30/2015  9:30 am   Type of Therapy: Group Therapy   Participation Level: Invited but did not attend.  Participation Quality: Invited but did not attend.   Megan OxfordKadijah R. [name], Megan Shaffer, Megan MajorsLCSWA

## 2015-12-01 NOTE — [facility] Group Notes (Signed)
Goals Group  Date/Time: 9:00AM Type of Therapy and Topic: Group Therapy: Goals Group: SMART Goals  ?  Participation Level: Moderate  ?  Description of Group:  ?  The purpose of a daily goals group is to assist and guide patients in setting recovery/wellness-related goals. The objective is to set goals as they relate to the crisis in which they were admitted. Patients will be using SMART goal modalities to set measurable goals. Characteristics of realistic goals will be discussed and patients will be assisted in setting and processing how one will reach their goal. Facilitator will also assist patients in applying interventions and coping skills learned in psycho-education groups to the SMART goal and process how one will achieve defined goal.  ?  Therapeutic Goals:  ?  -Patients will develop and document one goal related to or their crisis in which brought them into treatment.  -Patients will be guided by LCSW using SMART goal setting modality in how to set a measurable, attainable, realistic and time sensitive goal.  -Patients will process barriers in reaching goal.  -Patients will process interventions in how to overcome and successful in reaching goal.  ?  Patient's Goal: Patient invited but did not attend. ?  Therapeutic Modalities:  Motivational Interviewing  Engineer, manufacturing systemsCognitive Behavioral Therapy  Crisis Intervention Model  SMART goals setting

## 2015-12-01 NOTE — Tx Team (Signed)
Interdisciplinary Treatment and Diagnostic Plan Update  12/01/2015 Time of Session: 1030am LUDDIE BOGHOSIAN MRN: [id number]  Principal Diagnosis: Bipolar 2 disorder, major depressive episode (HCC)  Secondary Diagnoses: Principal Problem:   Bipolar 2 disorder, major depressive episode (HCC) Active Problems:   Alcohol use disorder, moderate, dependence (HCC)   Seizure disorder (HCC)   Cocaine use disorder, moderate, dependence (HCC)   Substance induced mood disorder (HCC)   Alcohol hallucinosis (HCC)   Tobacco use disorder   Opioid use disorder, moderate, dependence (HCC)   Current Medications:  Current Facility-Administered Medications  Medication Dose Route Frequency Provider Last Rate Last Dose  . acetaminophen (TYLENOL) tablet 650 mg  650 mg Oral Q6H PRN Audery Amel, MD   650 mg at 11/28/15 2120  . alum & mag hydroxide-simeth (MAALOX/MYLANTA) 200-200-20 MG/5ML suspension 30 mL  30 mL Oral Q4H PRN Audery Amel, MD      . buprenorphine (SUBUTEX) SL tablet 4 mg  4 mg Sublingual Daily Jolanta B Pucilowska, MD   4 mg at 12/01/15 0852  . cephALEXin (KEFLEX) capsule 500 mg  500 mg Oral Q8H Jolanta B Pucilowska, MD   500 mg at 12/01/15 6213  . folic acid (FOLVITE) tablet 1 mg  1 mg Oral Daily Audery Amel, MD   1 mg at 12/01/15 0853  . gabapentin (NEURONTIN) capsule 1,800 mg  1,800 mg Oral BID Audery Amel, MD   1,800 mg at 12/01/15 0853  . magnesium hydroxide (MILK OF MAGNESIA) suspension 30 mL  30 mL Oral Daily PRN Audery Amel, MD      . multivitamin with minerals tablet 1 tablet  1 tablet Oral Daily Audery Amel, MD   1 tablet at 12/01/15 0853  . nicotine (NICODERM CQ - dosed in mg/24 hours) patch 21 mg  21 mg Transdermal Daily Audery Amel, MD   21 mg at 12/01/15 0853  . phenytoin (DILANTIN) ER capsule 300 mg  300 mg Oral QHS Audery Amel, MD   300 mg at 11/30/15 2105  . QUEtiapine (SEROQUEL) tablet 300 mg  300 mg Oral QHS Shari Prows, MD   300 mg at 11/30/15 2105   . thiamine (VITAMIN B-1) tablet 100 mg  100 mg Oral Daily Audery Amel, MD   100 mg at 12/01/15 0865   Or  . thiamine (B-1) injection 100 mg  100 mg Intravenous Daily Audery Amel, MD       PTA Medications: Prescriptions Prior to Admission  Medication Sig Dispense Refill Last Dose  . ALPRAZolam (XANAX) 1 MG tablet Take 1 mg by mouth 3 (three) times daily as needed for anxiety.     . diazepam (VALIUM) 10 MG tablet Take 10 mg by mouth 3 (three) times daily as needed for anxiety (seizures).     . gabapentin (NEURONTIN) 600 MG tablet Take 1,800 mg by mouth 2 (two) times daily.     . phenytoin (DILANTIN) 100 MG ER capsule Take 300 mg by mouth at bedtime.        Treatment Modalities: Medication Management, Group therapy, Case management,  1 to 1 session with clinician, Psychoeducation, Recreational therapy.   Physician Treatment Plan for Primary Diagnosis: Bipolar 2 disorder, major depressive episode (HCC) Long Term Goal(s): Improvement in symptoms so as ready for discharge   Short Term Goals: Ability to identify changes in lifestyle to reduce recurrence of condition will improve, Ability to demonstrate self-control will improve, Ability to identify and develop effective  coping behaviors will improve, Ability to maintain clinical measurements within normal limits will improve, Compliance with prescribed medications will improve and Ability to identify triggers associated with substance abuse/mental health issues will improve  Medication Management: Evaluate patient's response, side effects, and tolerance of medication regimen.  Therapeutic Interventions: 1 to 1 sessions, Unit Group sessions and Medication administration.  Evaluation of Outcomes: Progressing  Physician Treatment Plan for Secondary Diagnosis: Principal Problem:   Bipolar 2 disorder, major depressive episode (HCC) Active Problems:   Alcohol use disorder, moderate, dependence (HCC)   Seizure disorder (HCC)   Cocaine use  disorder, moderate, dependence (HCC)   Substance induced mood disorder (HCC)   Alcohol hallucinosis (HCC)   Tobacco use disorder   Opioid use disorder, moderate, dependence (HCC)  Long Term Goal(s): Improvement in symptoms so as ready for discharge  Short Term Goals: Ability to identify changes in lifestyle to reduce recurrence of condition will improve, Ability to demonstrate self-control will improve, Ability to identify and develop effective coping behaviors will improve, Ability to maintain clinical measurements within normal limits will improve, Compliance with prescribed medications will improve and Ability to identify triggers associated with substance abuse/mental health issues will improve  Medication Management: Evaluate patient's response, side effects, and tolerance of medication regimen.  Therapeutic Interventions: 1 to 1 sessions, Unit Group sessions and Medication administration.  Evaluation of Outcomes: Progressing   RN Treatment Plan for Primary Diagnosis: Bipolar 2 disorder, major depressive episode (HCC) Long Term Goal(s): Knowledge of disease and therapeutic regimen to maintain health will improve  Short Term Goals: Ability to verbalize frustration and anger appropriately will improve, Ability to demonstrate self-control and Ability to participate in decision making will improve  Medication Management: RN will administer medications as ordered by provider, will assess and evaluate patient's response and provide education to patient for prescribed medication. RN will report any adverse and/or side effects to prescribing provider.  Therapeutic Interventions: 1 on 1 counseling sessions, Psychoeducation, Medication administration, Evaluate responses to treatment, Monitor vital signs and CBGs as ordered, Perform/monitor CIWA, COWS, AIMS and Fall Risk screenings as ordered, Perform wound care treatments as ordered.  Evaluation of Outcomes: Progressing   LCSW Treatment Plan  for Primary Diagnosis: Bipolar 2 disorder, major depressive episode (HCC) Long Term Goal(s): Safe transition to appropriate next level of care at discharge, Engage patient in therapeutic group addressing interpersonal concerns.  Short Term Goals: Engage patient in aftercare planning with referrals and resources, Increase emotional regulation, Facilitate acceptance of mental health diagnosis and concerns, Facilitate patient progression through stages of change regarding substance use diagnoses and concerns, Identify triggers associated with mental health/substance abuse issues and Increase skills for wellness and recovery  Therapeutic Interventions: Assess for all discharge needs, 1 to 1 time with Social worker, Explore available resources and support systems, Assess for adequacy in community support network, Educate family and significant other(s) on suicide prevention, Complete Psychosocial Assessment, Interpersonal group therapy.  Evaluation of Outcomes: Progressing   Progress in Treatment: Attending groups: No. Participating in groups: No. Taking medication as prescribed: Yes. Toleration medication: Yes. Family/Significant other contact made: No, Patient refused to consent to family contact. Patient understands diagnosis: Yes. Discussing patient identified problems/goals with staff: Yes. Medical problems stabilized or resolved: Yes. Denies suicidal/homicidal ideation: Yes. Issues/concerns per patient self-inventory: No. Other: n/a  New problem(s) identified: No new problems identified at this time.   New Short Term/Long Term Goal(s): No new goals identified at this time.   Discharge Plan or Barriers: Patient will discharge  and [facility] involving therapy and medication management.   Reason for Continuation of Hospitalization: Anxiety Depression Medication stabilization  Estimated Length of Stay: 3 to 5 days.    Attendees: Patient: Pricilla LarssonShenna M Stratmann 12/01/2015 1:31 PM  Physician: Dr. Jennet MaduroPucilowska, MD 12/01/2015 1:31 PM  Nursing: Shelia MediaJanet Jones, RN 12/01/2015 1:31 PM  RN Care Manager: 12/01/2015 1:31 PM  Social Worker: Fredrich BirksAmaris G. Garnette CzechSampson MSW, LCSWA 12/01/2015 1:31 PM  Recreational Therapist:  12/01/2015 1:31 PM  Other:  12/01/2015 1:31 PM  Other:  12/01/2015 1:31 PM  Other: 12/01/2015 1:31 PM    Scribe for Treatment Team: Arelia LongestAmaris G Pamella Samons, LCSWA 12/01/2015 1:36 PM

## 2015-12-01 NOTE — Progress Notes (Signed)
Patient states "I am in manic state now.I want to be like this.'"Pleasant & interacting with peers.Denies suicidal or homicidal ideations & AV hallucinations.Attended groups.Compliant with medications.Support & encouragement given.Safety maintained.

## 2015-12-01 NOTE — [facility] Group Notes (Signed)
[facility] LCSW Group Therapy   12/01/2015 9:30 am   Type of Therapy: Group Therapy   Participation Level: Invited but did not attend.  Participation Quality: Invited but did not attend.     Liyat Faulkenberry, MSW, LCSW-A  

## 2015-12-01 NOTE — Plan of Care (Signed)
Problem: Safety: Goal: Ability to remain free from injury will improve CIWA=0, no PRN given, 15 minute checks maintained for safety, clinical and moral support provided, patient encouraged to continue to express feelings and demonstrate safe care. Patient remains free from harm, will continue to monitor.

## 2015-12-01 NOTE — Progress Notes (Signed)
[facility] MD Progress Note  12/01/2015 4:02 PM Megan Shaffer  MRN:  [id number]  Subjective: Megan Shaffer is extremely tearful today. She does not believe that she could maintain sobriety if duscharged without Suboxone. She was explained again that we are no licensed to prescribe it. Now she is again suicidal. She will be referred to [facility] but she will have to follow their protocol. She tolerates medications well. Sleep and appetite are fair. Good program participation.   Principal Problem: Bipolar 2 disorder, major depressive episode (HCC) Diagnosis:   Patient Active Problem List   Diagnosis Date Noted  . Bipolar 2 disorder, major depressive episode (HCC) [F31.81] 11/29/2015  . Opioid use disorder, moderate, dependence (HCC) [F11.20] 11/29/2015  . Alcohol use disorder, moderate, dependence (HCC) [F10.20] 11/28/2015  . Seizure disorder (HCC) [G40.909] 11/28/2015  . Cocaine use disorder, moderate, dependence (HCC) [F14.20] 11/28/2015  . Substance induced mood disorder (HCC) [F19.94] 11/28/2015  . 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 Tobacco use disorder [F17.200] 11/28/2015   Total Time spent with patient: 20 minutes  Past Psychiatric History: depression, substance abuse.  Past Medical History:  Past Medical History:  Diagnosis Date  . Hepatitis C   . Seizures (HCC)     Past Surgical History:  Procedure Laterality Date  . ABDOMINAL SURGERY    . CHOLECYSTECTOMY    . TONSILLECTOMY    . TUBAL LIGATION     Family History:  Family History  Problem Relation Age of Onset  . Cancer Mother    Family Psychiatric  History: See H&P. Social History:  History  Alcohol Use  . Yes    Comment: Fifth of liquor daily      History  Drug Use  . Types: IV, Cocaine, Heroin    Comment: also Heroin    Social History   Social History  . Marital status: Divorced    Spouse name: N/A  . Number of children: N/A  . Years of education: N/A   Social History Main Topics   . Smoking status: Current Every Day Smoker    Packs/day: 1.50    Types: Cigarettes  . Smokeless tobacco: Never Used  . Alcohol use Yes     Comment: Fifth of liquor daily   . Drug use:     Types: IV, Cocaine, Heroin     Comment: also Heroin  . Sexual activity: Not Asked   Other Topics Concern  . None   Social History Narrative  . None   Additional Social History:    Prescriptions: xanax, valium per pt History of alcohol / drug use?: Yes Negative Consequences of Use: Financial, Legal, Personal relationships Withdrawal Symptoms: Seizures, Irritability, Nausea / Vomiting, Tremors, Sweats Onset of Seizures: past year per pt Date of most recent seizure: 11/27/2015 Name of Substance 1: alcohol 1 - Amount (size/oz): 5th of liquor daily 1 - Frequency: daily  1 - Last Use / Amount: 11/27/2015 Name of Substance 2: cociane  2 - Last Use / Amount: 11/27/2015                Sleep: Fair  Appetite:  Fair  Current Medications: Current Facility-Administered Medications  Medication Dose Route Frequency Provider Last Rate Last Dose  . acetaminophen (TYLENOL) tablet 650 mg  650 mg Oral Q6H PRN Audery Amel, MD   650 mg at 11/28/15 2120  . alum & mag hydroxide-simeth (MAALOX/MYLANTA) 200-200-20 MG/5ML suspension 30 mL  30 mL Oral Q4H PRN Audery Amel, MD      .  buprenorphine (SUBUTEX) SL tablet 4 mg  4 mg Sublingual Daily Harlie Ragle B Gates Jividen, MD   4 mg at 12/01/15 0852  . cephALEXin (KEFLEX) capsule 500 mg  500 mg Oral Q8H [place] B Evee Liska, MD   500 mg at 12/01/15 1454  . folic acid (FOLVITE) tablet 1 mg  1 mg Oral Daily Audery AmelJohn T Clapacs, MD   1 mg at 12/01/15 0853  . gabapentin (NEURONTIN) capsule 1,800 mg  1,800 mg Oral BID Audery AmelJohn T Clapacs, MD   1,800 mg at 12/01/15 0853  . magnesium hydroxide (MILK OF MAGNESIA) suspension 30 mL  30 mL Oral Daily PRN Audery AmelJohn T Clapacs, MD      . multivitamin with minerals tablet 1 tablet  1 tablet Oral Daily Audery AmelJohn T Clapacs, MD   1 tablet at 12/01/15  0853  . nicotine (NICODERM CQ - dosed in mg/24 hours) patch 21 mg  21 mg Transdermal Daily Audery AmelJohn T Clapacs, MD   21 mg at 12/01/15 0853  . phenytoin (DILANTIN) ER capsule 300 mg  300 mg Oral QHS Audery AmelJohn T Clapacs, MD   300 mg at 11/30/15 2105  . QUEtiapine (SEROQUEL) tablet 300 mg  300 mg Oral QHS Shari ProwsJolanta B Eugena Rhue, MD   300 mg at 11/30/15 2105  . thiamine (VITAMIN B-1) tablet 100 mg  100 mg Oral Daily Audery AmelJohn T Clapacs, MD   100 mg at 12/01/15 [id number]   Or  . thiamine (B-1) injection 100 mg  100 mg Intravenous Daily Audery AmelJohn T Clapacs, MD        Lab Results:  No results found for this or any previous visit (from the past 48 hour(s)).  Blood Alcohol level:  Lab Results  Component Value Date   ETH 23 (H) 11/27/2015   ETH  07/30/2008    <5        LOWEST DETECTABLE LIMIT FOR SERUM ALCOHOL IS 5 mg/dL FOR MEDICAL PURPOSES ONLY    Metabolic Disorder Labs: Lab Results  Component Value Date   HGBA1C 5.1 11/29/2015   No results found for: PROLACTIN Lab Results  Component Value Date   CHOL 120 11/29/2015   TRIG 141 11/29/2015   HDL 37 (L) 11/29/2015   CHOLHDL 3.2 11/29/2015   VLDL 28 11/29/2015   LDLCALC 55 11/29/2015    Physical Findings: AIMS: Facial and Oral Movements Muscles of Facial Expression: None, normal Lips and Perioral Area: None, normal Jaw: None, normal Tongue: None, normal,Extremity Movements Upper (arms, wrists, hands, fingers): None, normal Lower (legs, knees, ankles, toes): None, normal, Trunk Movements Neck, shoulders, hips: None, normal, Overall Severity Severity of abnormal movements (highest score from questions above): None, normal Incapacitation due to abnormal movements: None, normal Patient's awareness of abnormal movements (rate only patient's report): No Awareness, Dental Status Current problems with teeth and/or dentures?: No (dental caries, missing teeth) Does patient usually wear dentures?: No  CIWA:  CIWA-Ar Total: 2 COWS:      Musculoskeletal: Strength & Muscle Tone: within normal limits Gait & Station: normal Patient leans: N/A  Psychiatric Specialty Exam: Physical Exam  Nursing note and vitals reviewed.   Review of Systems  Gastrointestinal: Positive for nausea.  Neurological: Positive for tremors.  Psychiatric/Behavioral: Positive for depression, substance abuse and suicidal ideas. The patient is nervous/anxious.   All other systems reviewed and are negative.   Blood pressure 111/80, pulse 85, temperature 98 F (36.7 C), temperature source Oral, resp. rate 20, height 5\' 7"  (1.702 m), weight 68.9 kg (152 lb), last menstrual period 11/20/2015,  SpO2 99 %.Body mass index is 23.81 kg/m.  General Appearance: Casual  Eye Contact:  Good  Speech:  Clear and Coherent  Volume:  Normal  Mood:  Anxious and Depressed  Affect:  Appropriate  Thought Process:  Goal Directed  Orientation:  Full (Time, Place, and Person)  Thought Content:  WDL  Suicidal Thoughts:  Yes.  with intent/plan  Homicidal Thoughts:  No  Memory:  Immediate;   Fair Recent;   Fair Remote;   Fair  Judgement:  Impaired  Insight:  Lacking  Psychomotor Activity:  Normal  Concentration:  Concentration: Fair and Attention Span: Fair  Recall:  [employer] of Knowledge:  Fair  Language:  Fair  Akathisia:  No  Handed:  Right  AIMS (if indicated):     Assets:  Communication Skills Desire for Improvement Financial Resources/Insurance Physical Health Resilience Social Support  ADL's:  Intact  Cognition:  WNL  Sleep:  Number of Hours: 7.45     Treatment Plan Summary: Daily contact with patient to assess and evaluate symptoms and progress in treatment and Medication management   Megan Shaffer is a 45 year old female with a history of depression, mood instability and substance abuse admitted for worsening of depression and suicidal ideation in the context of medication noncompliance and relapse on drugs.   1. Suicidal ideation. The patient  is able to contract for safety in the hospital.  2. Mood. She was restarted on Seroquel for depression and mood stabilization.   3. Alcohol dependence. She is on Librium taper.  4. Opiate dependence. She relapsed on heroin 5 days ago.She gets Suboxone treatment for 3 days.  5. Seizure disorder. She was started on Dilantin and high dose Neurontin.  6. Smoking. Nicotine patch is available.  7. Substance abuse treatment. The patient declined residential treatment but will like to resume with Suboxone clinic and AA meetings.   8. Metabolic syndrome monitoring. Lipid profile and TSH are normal. Hemoglobin A1c 5.1.   9. EKG. Normal sinus rhythm. QT 390.  10. Disposition. She will be discharged to home with family. She will follow up with [facility].   Kristine Linea, MD 12/01/2015, 4:02 PM

## 2015-12-02 ENCOUNTER — Encounter: Payer: Self-pay | Admitting: Internal Medicine

## 2015-12-02 ENCOUNTER — Observation Stay
Admission: EM | Admit: 2015-12-02 | Discharge: 2015-12-04 | Payer: Medicare Other | Attending: General Surgery | Admitting: General Surgery

## 2015-12-02 ENCOUNTER — Ambulatory Visit
Admission: RE | Admit: 2015-12-02 | Discharge: 2015-12-02 | Disposition: A | Discharge status: Home / Self Care | Payer: Medicare Other | Source: Ambulatory Visit | Attending: Internal Medicine | Admitting: Internal Medicine

## 2015-12-02 ENCOUNTER — Encounter
Admission: EM | Disposition: A | Discharge status: Other Acute Inpatient Hospital | Payer: Self-pay | Attending: Internal Medicine

## 2015-12-02 DIAGNOSIS — Z9049 Acquired absence of other specified parts of digestive tract: Secondary | ICD-10-CM | POA: Insufficient documentation

## 2015-12-02 DIAGNOSIS — R1084 Generalized abdominal pain: Secondary | ICD-10-CM | POA: Diagnosis not present

## 2015-12-02 DIAGNOSIS — R1010 Upper abdominal pain, unspecified: Secondary | ICD-10-CM

## 2015-12-02 DIAGNOSIS — Z8719 Personal history of other diseases of the digestive system: Secondary | ICD-10-CM | POA: Insufficient documentation

## 2015-12-02 DIAGNOSIS — Z888 Allergy status to other drugs, medicaments and biological substances status: Secondary | ICD-10-CM | POA: Insufficient documentation

## 2015-12-02 DIAGNOSIS — F17210 Nicotine dependence, cigarettes, uncomplicated: Secondary | ICD-10-CM | POA: Insufficient documentation

## 2015-12-02 DIAGNOSIS — Z881 Allergy status to other antibiotic agents status: Secondary | ICD-10-CM | POA: Insufficient documentation

## 2015-12-02 DIAGNOSIS — Z79899 Other long term (current) drug therapy: Secondary | ICD-10-CM | POA: Insufficient documentation

## 2015-12-02 DIAGNOSIS — F1110 Opioid abuse, uncomplicated: Secondary | ICD-10-CM | POA: Insufficient documentation

## 2015-12-02 DIAGNOSIS — R109 Unspecified abdominal pain: Secondary | ICD-10-CM | POA: Diagnosis present

## 2015-12-02 DIAGNOSIS — R52 Pain, unspecified: Secondary | ICD-10-CM

## 2015-12-02 DIAGNOSIS — F1010 Alcohol abuse, uncomplicated: Secondary | ICD-10-CM | POA: Insufficient documentation

## 2015-12-02 DIAGNOSIS — Z9889 Other specified postprocedural states: Secondary | ICD-10-CM | POA: Insufficient documentation

## 2015-12-02 DIAGNOSIS — B1920 Unspecified viral hepatitis C without hepatic coma: Secondary | ICD-10-CM | POA: Insufficient documentation

## 2015-12-02 DIAGNOSIS — F1410 Cocaine abuse, uncomplicated: Secondary | ICD-10-CM | POA: Insufficient documentation

## 2015-12-02 DIAGNOSIS — F319 Bipolar disorder, unspecified: Secondary | ICD-10-CM | POA: Insufficient documentation

## 2015-12-02 DIAGNOSIS — G40909 Epilepsy, unspecified, not intractable, without status epilepticus: Secondary | ICD-10-CM | POA: Insufficient documentation

## 2015-12-02 LAB — BASIC METABOLIC PANEL
ANION GAP: 9 (ref 5–15)
BUN: 11 mg/dL (ref 6–20)
CHLORIDE: 103 mmol/L (ref 101–111)
CO2: 24 mmol/L (ref 22–32)
Calcium: 8.8 mg/dL — ABNORMAL LOW (ref 8.9–10.3)
Creatinine, Ser: 0.63 mg/dL (ref 0.44–1.00)
GFR calc non Af Amer: >60 mL/min (ref >60)
GLUCOSE: 92 mg/dL (ref 65–99)
Potassium: 4.5 mmol/L (ref 3.5–5.1)
Sodium: 136 mmol/L (ref 135–145)

## 2015-12-02 LAB — CBC
HCT: 39.1 % (ref 35.0–47.0)
HEMOGLOBIN: 13.1 g/dL (ref 12.0–16.0)
MCH: 28 pg (ref 26.0–34.0)
MCHC: 33.5 g/dL (ref 32.0–36.0)
MCV: 83.7 fL (ref 80.0–100.0)
PLATELETS: 311 10*3/uL (ref 150–440)
RBC: 4.67 MIL/uL (ref 3.80–5.20)
RDW: 12.7 % (ref 11.5–14.5)
WBC: 12.6 10*3/uL — ABNORMAL HIGH (ref 3.6–11.0)

## 2015-12-02 LAB — HEPATIC FUNCTION PANEL
ALBUMIN: 3.8 g/dL (ref 3.5–5.0)
ALK PHOS: 77 U/L (ref 38–126)
ALT: 22 U/L (ref 14–54)
AST: 32 U/L (ref 15–41)
BILIRUBIN TOTAL: 0.1 mg/dL — AB (ref 0.3–1.2)
Bilirubin, Direct: <0.1 mg/dL — ABNORMAL LOW (ref 0.1–0.5)
Total Protein: 6.9 g/dL (ref 6.5–8.1)

## 2015-12-02 LAB — LIPASE, BLOOD: Lipase: 34 U/L (ref 11–51)

## 2015-12-02 LAB — AMYLASE: Amylase: 59 U/L (ref 28–100)

## 2015-12-02 SURGERY — LAPAROTOMY, EXPLORATORY
Anesthesia: General

## 2015-12-02 MED ORDER — ALPRAZOLAM 1 MG PO TABS: 1.0000 mg | ORAL_TABLET | Freq: Three times a day (TID) | ORAL | Status: DC | PRN

## 2015-12-02 MED ORDER — HYDROCODONE-ACETAMINOPHEN 5-325 MG PO TABS
1.0000 | ORAL_TABLET | ORAL | Status: DC | PRN
  Administered 2015-12-03 (×3): 1 via ORAL
  Filled 2015-12-02 (×3): qty 1

## 2015-12-02 MED ORDER — SODIUM CHLORIDE 0.9 % IV SOLN
INTRAVENOUS | Status: DC
  Administered 2015-12-02 – 2015-12-03 (×2): via INTRAVENOUS

## 2015-12-02 MED ORDER — HYDROMORPHONE HCL 1 MG/ML IJ SOLN
1.0000 mg | Freq: Once | INTRAMUSCULAR | Status: AC
  Administered 2015-12-02: 1 mg via INTRAVENOUS
  Filled 2015-12-02: qty 1

## 2015-12-02 MED ORDER — NICOTINE 21 MG/24HR TD PT24
21.0000 mg | MEDICATED_PATCH | Freq: Every day | TRANSDERMAL | Status: DC
Start: 2015-12-02 — End: 2015-12-04
  Administered 2015-12-02 – 2015-12-03 (×2): 21 mg via TRANSDERMAL
  Filled 2015-12-02 (×2): qty 1

## 2015-12-02 MED ORDER — ACETAMINOPHEN 650 MG RE SUPP
650.0000 mg | Freq: Four times a day (QID) | RECTAL | Status: DC | PRN
Start: 2015-12-02 — End: 2015-12-04

## 2015-12-02 MED ORDER — HYDROMORPHONE HCL 1 MG/ML IJ SOLN
2.0000 mg | Freq: Once | INTRAMUSCULAR | Status: AC
Start: 2015-12-02 — End: 2015-12-02
  Administered 2015-12-02: 2 mg via INTRAVENOUS
  Filled 2015-12-02: qty 2

## 2015-12-02 MED ORDER — ACETAMINOPHEN 10 MG/ML IV SOLN
1000.0000 mg | Freq: Once | INTRAVENOUS | Status: AC
  Administered 2015-12-02: 1000 mg via INTRAVENOUS
  Filled 2015-12-02: qty 100

## 2015-12-02 MED ORDER — QUETIAPINE FUMARATE 300 MG PO TABS
300.0000 mg | ORAL_TABLET | Freq: Every day | ORAL | Status: DC
  Administered 2015-12-02 – 2015-12-03 (×2): 300 mg via ORAL
  Filled 2015-12-02 (×2): qty 1

## 2015-12-02 MED ORDER — MORPHINE SULFATE (PF) 4 MG/ML IV SOLN
4.0000 mg | Freq: Once | INTRAVENOUS | Status: AC
  Administered 2015-12-02: 4 mg via INTRAVENOUS

## 2015-12-02 MED ORDER — ACETAMINOPHEN 325 MG PO TABS: 650.0000 mg | ORAL_TABLET | Freq: Four times a day (QID) | ORAL | Status: DC | PRN

## 2015-12-02 MED ORDER — SODIUM CHLORIDE 0.9% FLUSH
3.0000 mL | Freq: Two times a day (BID) | INTRAVENOUS | Status: DC
  Administered 2015-12-03: 3 mL via INTRAVENOUS

## 2015-12-02 MED ORDER — PANTOPRAZOLE SODIUM 40 MG IV SOLR
80.0000 mg | Freq: Once | INTRAVENOUS | Status: AC
  Administered 2015-12-02: 80 mg via INTRAVENOUS
  Filled 2015-12-02: qty 80

## 2015-12-02 MED ORDER — SODIUM CHLORIDE 0.9 % IV SOLN
8.0000 mg/h | INTRAVENOUS | Status: DC
  Administered 2015-12-02: 8 mg/h via INTRAVENOUS
  Filled 2015-12-02: qty 80

## 2015-12-02 MED ORDER — MORPHINE SULFATE (PF) 4 MG/ML IV SOLN: 4.0000 mg | Freq: Once | INTRAVENOUS | Status: DC

## 2015-12-02 MED ORDER — GABAPENTIN 600 MG PO TABS
1800.0000 mg | ORAL_TABLET | Freq: Two times a day (BID) | ORAL | Status: DC
  Administered 2015-12-02 – 2015-12-03 (×3): 1800 mg via ORAL
  Filled 2015-12-02 (×3): qty 3

## 2015-12-02 MED ORDER — PANTOPRAZOLE SODIUM 40 MG IV SOLR: 40.0000 mg | Freq: Two times a day (BID) | INTRAVENOUS | Status: DC

## 2015-12-02 MED ORDER — SODIUM CHLORIDE 0.9 % IV BOLUS (SEPSIS): 1000.0000 mL | Freq: Once | INTRAVENOUS | Status: DC

## 2015-12-02 MED ORDER — PHENYTOIN SODIUM EXTENDED 100 MG PO CAPS
300.0000 mg | ORAL_CAPSULE | Freq: Every day | ORAL | Status: DC
  Administered 2015-12-02 – 2015-12-03 (×2): 300 mg via ORAL
  Filled 2015-12-02 (×2): qty 3

## 2015-12-02 MED ORDER — PROMETHAZINE HCL 25 MG/ML IJ SOLN
25.0000 mg | INTRAMUSCULAR | Status: DC | PRN
Start: 2015-12-02 — End: 2015-12-03
  Administered 2015-12-02 – 2015-12-03 (×4): 25 mg via INTRAVENOUS
  Filled 2015-12-02 (×4): qty 1

## 2015-12-02 MED ORDER — HYDROMORPHONE HCL 1 MG/ML IJ SOLN
1.0000 mg | INTRAMUSCULAR | Status: DC | PRN
  Administered 2015-12-03 (×3): 1 mg via INTRAVENOUS
  Filled 2015-12-02 (×3): qty 1

## 2015-12-02 MED ORDER — MORPHINE SULFATE (PF) 2 MG/ML IV SOLN
2.0000 mg | INTRAVENOUS | Status: DC | PRN
  Administered 2015-12-02: 2 mg via INTRAVENOUS
  Filled 2015-12-02: qty 1

## 2015-12-02 SURGICAL SUPPLY — 31 items
BULB RESERV EVAC DRAIN JP 100C (MISCELLANEOUS) ×3 IMPLANT
CANISTER SUCT 1200ML W/VALVE (MISCELLANEOUS) ×3 IMPLANT
CATH TRAY 16F METER LATEX (MISCELLANEOUS) ×3 IMPLANT
CHLORAPREP W/TINT 26ML (MISCELLANEOUS) ×3 IMPLANT
COVER CLAMP SIL LG PBX B (MISCELLANEOUS) ×3 IMPLANT
DRAIN CHANNEL JP 15F RND 16 (MISCELLANEOUS) ×3 IMPLANT
DRAPE LAPAROTOMY 100X77 ABD (DRAPES) ×3 IMPLANT
DRSG TELFA 3X8 NADH (GAUZE/BANDAGES/DRESSINGS) ×3 IMPLANT
ELECT BLADE 6 FLAT ULTRCLN (ELECTRODE) ×3 IMPLANT
ELECT REM PT RETURN 9FT ADLT (ELECTROSURGICAL) ×3
ELECTRODE REM PT RTRN 9FT ADLT (ELECTROSURGICAL) ×1 IMPLANT
GAUZE SPONGE 4X4 12PLY STRL (GAUZE/BANDAGES/DRESSINGS) ×3 IMPLANT
GLOVE BIO SURGEON STRL SZ7.5 (GLOVE) ×3 IMPLANT
GLOVE INDICATOR 8.0 STRL GRN (GLOVE) ×3 IMPLANT
GOWN STRL REUS W/ TWL LRG LVL3 (GOWN DISPOSABLE) ×2 IMPLANT
GOWN STRL REUS W/TWL LRG LVL3 (GOWN DISPOSABLE) ×4
HOLDER FOLEY CATH W/STRAP (MISCELLANEOUS) ×3 IMPLANT
KIT RM TURNOVER STRD PROC AR (KITS) ×3 IMPLANT
LABEL OR SOLS (LABEL) ×3 IMPLANT
NS IRRIG 1000ML POUR BTL (IV SOLUTION) ×3 IMPLANT
PACK BASIN MAJOR ARMC (MISCELLANEOUS) ×3 IMPLANT
SET YANKAUER POOLE SUCT (MISCELLANEOUS) ×3 IMPLANT
SPONGE LAP 18X18 5 PK (GAUZE/BANDAGES/DRESSINGS) ×3 IMPLANT
STAPLER SKIN PROX 35W (STAPLE) ×3 IMPLANT
SUT ETH BLK MONO 3 0 FS 1 12/B (SUTURE) ×3 IMPLANT
SUT PROLENE 0 CT 1 30 (SUTURE) ×12 IMPLANT
SUT SILK 3-0 (SUTURE) ×3 IMPLANT
SUT VIC AB 2-0 BRD 54 (SUTURE) ×3 IMPLANT
SUT VIC AB 3-0 SH 27 (SUTURE) ×4
SUT VIC AB 3-0 SH 27X BRD (SUTURE) ×2 IMPLANT
SUT VICRYL 3-0 SH-1 18IN (SUTURE) ×3 IMPLANT

## 2015-12-02 NOTE — Progress Notes (Signed)
I talked with Dr Renae GlossWieting, aware of continued pain. Dr Lemar LivingsByrnett aware of pt thrashing around in bed, distended, tender and painful abdomen states "I feel like I am going to die". Called pt friend at her request he is on his way, reassurance given to pt

## 2015-12-02 NOTE — Progress Notes (Signed)
Recreation Therapy Notes  Date: 09.08.17 Time: 1:00 pm Location: Craft Room  Group Topic: Communication, Problem Solving, Teamwork  Goal Area(s) Addresses:  Patient will effectively work with peer towards shared goal. Patient will identify skills used to make activity successful. Patient will identify benefit of using group skills effectively post d/c.  Behavioral Response: Attentive, Interactive   Intervention: [employer] Tower  Activity: Patients were given 15 pipe cleaners and were instructed to build a free standing tower. Patients were given 2 minutes to strategize. After they had been building for about 5 minutes, they were instructed to put their dominant hand behind their back. After another 5 minutes of building, they were instructed to stop talking to each other.  Education: LRT educated patients on healthy support systems.  Education Outcome: Acknowledges education/In group clarification offered   Clinical Observations/Feedback: Patient completed activity by working with peers to build a tower. Patient used effective communication, problem solving, and teamwork skills. Patient contributed to group discussion by stating what skills her team used, what communication, problem solving, and teamwork are important, how she felt after using these skills, what prevents her from using these skills, and what would change for her if she started using these skills at home.  Jacquelynn CreeGreene,Nevaan Bunton M, LRT/CTRS 12/02/2015 3:32 PM

## 2015-12-02 NOTE — Consult Note (Signed)
Paged for Rapid Response; patient in excruciating pain and [facility]  Dr. Linton HamMoved for testing and further support.  Pt struggles with depression, chaplain provided pastoral presence, comfort and words of support through initial encounter.  Will follow up to provide support as patient admitted to Med/Surg unit for further testing/possible surgery.

## 2015-12-02 NOTE — [facility] Group Notes (Signed)
[facility] LCSW Group Therapy   12/02/2015 9:30 AM   Type of Therapy: Group Therapy   Participation Level: Active   Participation Quality: Attentive, Sharing and Supportive   Affect: Appropriate   Cognitive: Alert and Oriented   Insight: Developing/Improving and Engaged   Engagement in Therapy: Developing/Improving and Engaged   Modes of Intervention: Clarification, Confrontation, Discussion, Education, Exploration, Limit-setting, Orientation, Problem-solving, Rapport Building, Dance movement psychotherapisteality Testing, Socialization and Support   Summary of Progress/Problems: The topic for today was feelings about relapse. Pt discussed what relapse prevention is to them and identified triggers that they are on the path to relapse. Pt processed their feeling towards relapse and was able to relate to peers. Pt discussed coping skills that can be used for relapse prevention. Patient identifies cravings, anger, and panic as triggers in regards to relapse. She stated that being involved in treatment and medications such as suboxone will be helpful.   Hampton AbbotKadijah Everson Mott, MSW, Theresia MajorsLCSWA

## 2015-12-02 NOTE — Consult Note (Signed)
Reason for Consult: Severe abdominal pain Referring Physician: Alford Highland, MD  Megan Shaffer is an 45 y.o. female.  HPI:  This 45 year old woman self-referred to behavioral medicine after reinitiating narcotic use after a 3 year hiatus. The patient had been managed with Suboxone but had missed transportation on 1 day, was stressed by the recent death of her mother and fell back and old habits. She reports that she was in her usual health until this evening a proximally half an hour after a meal of [place] and mashed potatoes with gravy. She had some nausea this time and then but 30 minutes later severe abdominal pain. The pain progressed and she reported abdominal distention as well. She was transferred him behavioral medicine to the medical floor and surgical consultation requested.  The patient has had an episode of pancreatitis in the past, and on questioning reported that this pain was similar although slightly more severe than her last episode.  The patient was found on her hands and knees with a pillow jammed up into the epigastrium, reporting this was the only position where she could get any relief.  Past Medical History:  Diagnosis Date  . Hepatitis C   . Seizures (HCC)     Past Surgical History:  Procedure Laterality Date  . ABDOMINAL SURGERY    . CHOLECYSTECTOMY    . OOPHORECTOMY    . OVARIAN CYST SURGERY    . TONSILLECTOMY    . TUBAL LIGATION      Family History  Problem Relation Age of Onset  . Cancer Mother   . Healthy Father     Social History:  reports that she has been smoking Cigarettes.  She has been smoking about 1.50 packs per day. She has never used smokeless tobacco. She reports that she drinks alcohol. She reports that she uses drugs, including IV, Cocaine, and Heroin.   Allergies:  Allergies  Allergen Reactions  . Ciprofloxacin Other (See Comments)    Unknown reaction  . Famotidine Other (See Comments)    Unknown reaction.  Earnestine Leys [Ziprasidone  Hcl] Other (See Comments)    Unknown reaction  . Imitrex [Sumatriptan] Other (See Comments)    Unknown reaction  . Toradol [Ketorolac Tromethamine] Other (See Comments)    Unknown reaction  . Zofran [Ondansetron Hcl] Other (See Comments)    Unknown reaction    Medications: I have reviewed the patient's current medications.  Results for orders placed or performed during the hospital encounter of 12/02/15 (from the past 48 hour(s))  Hepatic function panel     Status: Abnormal   Collection Time: 12/02/15  8:16 PM  Result Value Ref Range   Total Protein 6.9 6.5 - 8.1 g/dL   Albumin 3.8 3.5 - 5.0 g/dL   AST 32 15 - 41 U/L   ALT 22 14 - 54 U/L   Alkaline Phosphatase 77 38 - 126 U/L   Total Bilirubin 0.1 (L) 0.3 - 1.2 mg/dL   Bilirubin, Direct <6.9 (L) 0.1 - 0.5 mg/dL   Indirect Bilirubin NOT CALCULATED 0.3 - 0.9 mg/dL    Ct Abdomen Pelvis Wo Contrast  Result Date: 12/02/2015 CLINICAL DATA:  45 year old female with generalized chest pain and abdominal pain. History of hepatitis-C. EXAM: CT CHEST, ABDOMEN AND PELVIS WITHOUT CONTRAST TECHNIQUE: Multidetector CT imaging of the chest, abdomen and pelvis was performed following the standard protocol without IV contrast. COMPARISON:  CT dated 04/14/11 FINDINGS: Evaluation of this exam is limited in the absence of intravenous contrast. Evaluation  is also somewhat limited due to respiratory motion artifact. CT CHEST FINDINGS There is a 4 mm right lower lobe nodule (Series 2 image 48), stable since study dated 2013. A 9 mm nodular density along the left fissure (series 2, image 31) also appears similar to prior study and may represent scarring. The lungs are clear. There is no pleural effusion or pneumothorax. The central airways are patent. There is no aneurysmal dilatation of the thoracic aorta. The central pulmonary arteries are grossly unremarkable on this noncontrast study. There is no cardiomegaly or pericardial effusion. No hilar or mediastinal  adenopathy noted. The esophagus is grossly unremarkable. No thyroid nodules identified. There is no axillary adenopathy. The chest wall soft tissues appear unremarkable. The osseous structures are intact. CT ABDOMEN PELVIS FINDINGS No intra-abdominal free air or free fluid. Cholecystectomy. The liver, pancreas, spleen, adrenal glands, kidneys, visualized ureters, and urinary bladder appear unremarkable. The uterus is anteverted and grossly unremarkable. The ovaries appear grossly unremarkable as well. Constipation. There is no evidence of bowel obstruction or active inflammation. Normal appendix. The abdominal aorta and IVC appear grossly unremarkable on this noncontrast study. No portal venous gas identified. There is no adenopathy. The abdominal wall soft tissues appear unremarkable. The osseous structures are intact. IMPRESSION: No acute intrathoracic, abdominal, or pelvic pathology. Electronically Signed   By: Elgie CollardArash  Radparvar M.D.   On: 12/02/2015 21:26   Ct Chest Wo Contrast  Result Date: 12/02/2015 CLINICAL DATA:  45 year old female with generalized chest pain and abdominal pain. History of hepatitis-C. EXAM: CT CHEST, ABDOMEN AND PELVIS WITHOUT CONTRAST TECHNIQUE: Multidetector CT imaging of the chest, abdomen and pelvis was performed following the standard protocol without IV contrast. COMPARISON:  CT dated 04/14/11 FINDINGS: Evaluation of this exam is limited in the absence of intravenous contrast. Evaluation is also somewhat limited due to respiratory motion artifact. CT CHEST FINDINGS There is a 4 mm right lower lobe nodule (Series 2 image 48), stable since study dated 2013. A 9 mm nodular density along the left fissure (series 2, image 31) also appears similar to prior study and may represent scarring. The lungs are clear. There is no pleural effusion or pneumothorax. The central airways are patent. There is no aneurysmal dilatation of the thoracic aorta. The central pulmonary arteries are grossly  unremarkable on this noncontrast study. There is no cardiomegaly or pericardial effusion. No hilar or mediastinal adenopathy noted. The esophagus is grossly unremarkable. No thyroid nodules identified. There is no axillary adenopathy. The chest wall soft tissues appear unremarkable. The osseous structures are intact. CT ABDOMEN PELVIS FINDINGS No intra-abdominal free air or free fluid. Cholecystectomy. The liver, pancreas, spleen, adrenal glands, kidneys, visualized ureters, and urinary bladder appear unremarkable. The uterus is anteverted and grossly unremarkable. The ovaries appear grossly unremarkable as well. Constipation. There is no evidence of bowel obstruction or active inflammation. Normal appendix. The abdominal aorta and IVC appear grossly unremarkable on this noncontrast study. No portal venous gas identified. There is no adenopathy. The abdominal wall soft tissues appear unremarkable. The osseous structures are intact. IMPRESSION: No acute intrathoracic, abdominal, or pelvic pathology. Electronically Signed   By: Elgie CollardArash  Radparvar M.D.   On: 12/02/2015 21:26    Review of Systems  Constitutional: Negative.   HENT: Negative.   Eyes: Negative.   Respiratory: Negative.   Cardiovascular: Negative.   Gastrointestinal: Positive for abdominal pain, nausea and vomiting.  Genitourinary: Negative.   Musculoskeletal: Negative.   Skin: Negative.   Neurological: Negative.   Endo/Heme/Allergies: Negative.  Psychiatric/Behavioral: Negative.    Blood pressure (!) 144/79, temperature 99.6 F (37.6 C), temperature source Oral, resp. rate (!) 26, height 5\' 9"  (1.753 m), weight 161 lb (73 kg), last menstrual period 11/20/2015, SpO2 98 %. Physical Exam  Constitutional: She is oriented to person, place, and time. She appears well-developed.  HENT:  Head: Normocephalic.  Cardiovascular: Normal rate and regular rhythm.   Respiratory: Effort normal and breath sounds normal.  GI: She exhibits distension.      Neurological: She is alert and oriented to person, place, and time.  Skin: Skin is warm and dry.  Psychiatric: She has a normal mood and affect. Her behavior is normal. Judgment and thought content normal.   Laboratory/imaging: The CT scan was reviewed. No free air. Normal vascularity with limitation of contrast timing directed toward the chest rather than the abdomen. Previous cholecystectomy. No bowel distention, no free fluid.  There is moderate distention of the stomach with floculated material, unlikely to benefit from nasogastric tube drainage. Laboratory studies showed a white blood cell count 12,000, slightly improved from earlier in her hospitalization. Liver function studies, amylase and lipase were normal. Electrolytes were normal.  Assessment/Plan: Severe abdominal pain, likely recurrent bout of pancreatitis. Considering her past narcotic use, we'll work towards aggressive pain control as well as management of her nausea. The case was reviewed by phone with Dr. Renae Gloss.  Megan Shaffer 12/02/2015, 10:09 PM

## 2015-12-02 NOTE — Progress Notes (Signed)
D: Patient appears very tearful. States that she wants to get a hold of her son because he's currently doing drugs. Denies SI/HI/AVH. Just states she's very anxious. Seen pacing up the halls with other patients.  A: Medication given with education. Encouragement provided.   R: Patient was compliant with medication. She has been calm and cooperative. Safety maintained with 15 min checks.

## 2015-12-02 NOTE — B[facility] Suicide Risk Assessment (Signed)
[facility]   Principal Problem: Bipolar 2 disorder, major depressive episode [facility](HCC) Discharge Diagnoses:  Patient Active Problem List   Diagnosis Date Noted  . Abdominal pain [R10.9] 12/02/2015  . Bipolar 2 disorder, major depressive episode (HCC) [F31.81] 11/29/2015  . Opioid use disorder, moderate, dependence (HCC) [F11.20] 11/29/2015  . Alcohol use disorder, moderate, dependence (HCC) [F10.20] 11/28/2015  . Seizure disorder (HCC) [G40.909] 11/28/2015  . Cocaine use disorder, moderate, dependence (HCC) [F14.20] 11/28/2015  . Substance induced mood disorder (HCC) [F19.94] 11/28/2015  . 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 Tobacco use disorder [F17.200] 11/28/2015    Total Time spent with patient: 30 minutes  Musculoskeletal: Strength & Muscle Tone: within normal limits Gait & Station: normal Patient leans: N/A  Psychiatric Specialty Exam: Review of Systems  Gastrointestinal: Positive for abdominal pain.  Psychiatric/Behavioral: Positive for substance abuse.  All other systems reviewed and are negative.   Blood pressure (!) 144/79, pulse (!) 107, temperature 99.4 F (37.4 C), temperature source Oral, resp. rate (!) 26, height 5\' 7"  (1.702 m), weight 68.9 kg (152 lb), last menstrual period 11/20/2015, SpO2 98 %.Body mass index is 23.81 kg/m.  General Appearance: Casual  Eye Contact::  Good  Speech:  Clear and Coherent409  Volume:  Normal  Mood:  Anxious  Affect:  Appropriate  Thought Process:  Goal Directed  Orientation:  Full (Time, Place, and Person)  Thought Content:  WDL  Suicidal Thoughts:  No  Homicidal Thoughts:  No  Memory:  Immediate;   Fair Recent;   Fair Remote;   Fair  Judgement:  Impaired  Insight:  Shallow  Psychomotor Activity:  Normal  Concentration:  Good  Recall:  F[employer]ervFair  Fund of Knowledge:Fair  Language: Fair  Akathisia:  No  Handed:  Right  AIMS (if indicated):     Assets:  Communication Skills Desire  for Improvement Financial Resources/Insurance Resilience  Sleep:  Number of Hours: 5.75  Cognition: WNL  ADL's:  Intact   Mental Status Per Nursing Assessment::   On Admission:  Suicidal ideation indicated by patient  Demographic Factors:  Caucasian, Low socioeconomic status and Unemployed  Loss Factors: Decline in physical health and Financial problems/change in socioeconomic status  Historical Factors: Prior suicide attempts and Impulsivity  Risk Reduction Factors:   Sense of responsibility to family  Continued Clinical Symptoms:  Bipolar Disorder:   Depressive phase Depression:   Comorbid alcohol abuse/dependence Impulsivity Alcohol/Substance Abuse/Dependencies Epilepsy  Cognitive Features That Contribute To Risk:  None    Suicide Risk:  Minimal: No identifiable suicidal ideation.  Patients presenting with no risk factors but with morbid ruminations; may be classified as minimal risk based on the severity of the depressive symptoms  Follow-up Information    [facility].   Why:  Please arrive during Walk-in hours M-F 9a-4p. Please bring photo ID and discharge summary. This agency will provide outpatient therapy and medication management. Suboxone maintenance treatment will be determine after initial assessment.  Contact information: [address] [place], [id number] Demetrius [phone]- [phone]          Plan Of Care/Follow-up recommendations:  Activity:  as tolerated. Diet:  low sodium heart healthy. Other:  keep follow up appointments.  Kristine LineaJolanta Pucilowska, MD 12/02/2015, 9:20 PM

## 2015-12-02 NOTE — Progress Notes (Signed)
Pt cont to c/o severe abd pain, now radiating up to sternum and to back.  States, "I feel like I'm burning inside."  Pt's sx reported to Dr Hilton SinclairWeiting.  MD in process of placing orders.

## 2015-12-02 NOTE — Progress Notes (Signed)
[facility]  12/02/2015 1:35 PM Megan LarssonShenna M Shaffer  MRN:  [id number]  Subjective: Megan Shaffer is extremely tearful, depressed and suicidal today. She does not believe that she could maintain sobriety if discharged without Suboxone. She was explained again that we are no licensed to prescribe it. She has intake appointment with [facility] Monday. She tolerates medications well. Sleep and appetite are fair. Good program participation.   Principal Problem: Bipolar 2 disorder, major depressive episode (HCC) Diagnosis:   Patient Active Problem List   Diagnosis Date Noted  . Bipolar 2 disorder, major depressive episode (HCC) [F31.81] 11/29/2015  . Opioid use disorder, moderate, dependence (HCC) [F11.20] 11/29/2015  . Alcohol use disorder, moderate, dependence (HCC) [F10.20] 11/28/2015  . Seizure disorder (HCC) [G40.909] 11/28/2015  . Cocaine use disorder, moderate, dependence (HCC) [F14.20] 11/28/2015  . Substance induced mood disorder (HCC) [F19.94] 11/28/2015  . 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 Tobacco use disorder [F17.200] 11/28/2015   Total Time spent with patient: 20 minutes  Past Psychiatric History: depression, substance abuse.  Past Medical History:  Past Medical History:  Diagnosis Date  . Hepatitis C   . Seizures (HCC)     Past Surgical History:  Procedure Laterality Date  . ABDOMINAL SURGERY    . CHOLECYSTECTOMY    . TONSILLECTOMY    . TUBAL LIGATION     Family History:  Family History  Problem Relation Age of Onset  . Cancer Mother    Family Psychiatric  History: See H&P. Social History:  History  Alcohol Use  . Yes    Comment: Fifth of liquor daily      History  Drug Use  . Types: IV, Cocaine, Heroin    Comment: also Heroin    Social History   Social History  . Marital status: Divorced    Spouse name: N/A  . Number of children: N/A  . Years of education: N/A   Social History Main Topics  . Smoking status: Current  Every Day Smoker    Packs/day: 1.50    Types: Cigarettes  . Smokeless tobacco: Never Used  . Alcohol use Yes     Comment: Fifth of liquor daily   . Drug use:     Types: IV, Cocaine, Heroin     Comment: also Heroin  . Sexual activity: Not Asked   Other Topics Concern  . None   Social History Narrative  . None   Additional Social History:    Prescriptions: xanax, valium per pt History of alcohol / drug use?: Yes Negative Consequences of Use: Financial, Legal, Personal relationships Withdrawal Symptoms: Seizures, Irritability, Nausea / Vomiting, Tremors, Sweats Onset of Seizures: past year per pt Date of most recent seizure: 11/27/2015 Name of Substance 1: alcohol 1 - Amount (size/oz): 5th of liquor daily 1 - Frequency: daily  1 - Last Use / Amount: 11/27/2015 Name of Substance 2: cociane  2 - Last Use / Amount: 11/27/2015                Sleep: Fair  Appetite:  Fair  Current Medications: Current Facility-Administered Medications  Medication Dose Route Frequency Provider Last Rate Last Dose  . acetaminophen (TYLENOL) tablet 650 mg  650 mg Oral Q6H PRN Audery AmelJohn T Clapacs, MD   650 mg at 12/01/15 2219  . alum & mag hydroxide-simeth (MAALOX/MYLANTA) 200-200-20 MG/5ML suspension 30 mL  30 mL Oral Q4H PRN Audery AmelJohn T Clapacs, MD      . buprenorphine (SUBUTEX) SL tablet  4 mg  4 mg Sublingual Daily Kitrina Maurin B Dontavian Marchi, MD   4 mg at 12/02/15 0857  . cephALEXin (KEFLEX) capsule 500 mg  500 mg Oral Q8H Amiah Frohlich B Chris Narasimhan, MD   500 mg at 12/02/15 0636  . folic acid (FOLVITE) tablet 1 mg  1 mg Oral Daily Audery Amel, MD   1 mg at 12/02/15 0857  . gabapentin (NEURONTIN) capsule 1,800 mg  1,800 mg Oral BID Audery Amel, MD   1,800 mg at 12/02/15 0857  . magnesium hydroxide (MILK OF MAGNESIA) suspension 30 mL  30 mL Oral Daily PRN Audery Amel, MD      . multivitamin with minerals tablet 1 tablet  1 tablet Oral Daily Audery Amel, MD   1 tablet at 12/02/15 0857  . nicotine (NICODERM  CQ - dosed in mg/24 hours) patch 21 mg  21 mg Transdermal Daily Audery Amel, MD   21 mg at 12/02/15 0856  . phenytoin (DILANTIN) ER capsule 300 mg  300 mg Oral QHS Audery Amel, MD   300 mg at 12/01/15 2219  . QUEtiapine (SEROQUEL) tablet 300 mg  300 mg Oral QHS Latoyia Tecson B Cassey Bacigalupo, MD   300 mg at 12/01/15 2219    Lab Results:  No results found for this or any previous visit (from the past 48 hour(s)).  Blood Alcohol level:  Lab Results  Component Value Date   ETH 23 (H) 11/27/2015   ETH  07/30/2008    <5        LOWEST DETECTABLE LIMIT FOR SERUM ALCOHOL IS 5 mg/dL FOR MEDICAL PURPOSES ONLY    Metabolic Disorder Labs: Lab Results  Component Value Date   HGBA1C 5.1 11/29/2015   No results found for: PROLACTIN Lab Results  Component Value Date   CHOL 120 11/29/2015   TRIG 141 11/29/2015   HDL 37 (L) 11/29/2015   CHOLHDL 3.2 11/29/2015   VLDL 28 11/29/2015   LDLCALC 55 11/29/2015    Physical Findings: AIMS: Facial and Oral Movements Muscles of Facial Expression: None, normal Lips and Perioral Area: None, normal Jaw: None, normal Tongue: None, normal,Extremity Movements Upper (arms, wrists, hands, fingers): None, normal Lower (legs, knees, ankles, toes): None, normal, Trunk Movements Neck, shoulders, hips: None, normal, Overall Severity Severity of abnormal movements (highest score from questions above): None, normal Incapacitation due to abnormal movements: None, normal Patient's awareness of abnormal movements (rate only patient's report): No Awareness, Dental Status Current problems with teeth and/or dentures?: No (dental caries, missing teeth) Does patient usually wear dentures?: No  CIWA:  CIWA-Ar Total: 0 COWS:     Musculoskeletal: Strength & Muscle Tone: within normal limits Gait & Station: normal Patient leans: N/A  Psychiatric Specialty Exam: Physical Exam  Nursing note and vitals reviewed.   Review of Systems  Gastrointestinal: Positive for  nausea.  Neurological: Positive for tremors.  Psychiatric/Behavioral: Positive for depression, substance abuse and suicidal ideas. The patient is nervous/anxious.   All other systems reviewed and are negative.   Blood pressure (!) 124/96, pulse 89, temperature 97.8 F (36.6 C), temperature source Oral, resp. rate 20, height 5\' 7"  (1.702 m), weight 68.9 kg (152 lb), last menstrual period 11/20/2015, SpO2 99 %.Body mass index is 23.81 kg/m.  General Appearance: Casual  Eye Contact:  Good  Speech:  Clear and Coherent  Volume:  Normal  Mood:  Anxious and Depressed  Affect:  Appropriate  Thought Process:  Goal Directed  Orientation:  Full (Time, Place, and  Person)  Thought Content:  WDL  Suicidal Thoughts:  Yes.  with intent/plan  Homicidal Thoughts:  No  Memory:  Immediate;   Fair Recent;   Fair Remote;   Fair  Judgement:  Impaired  Insight:  Lacking  Psychomotor Activity:  Normal  Concentration:  Concentration: Fair and Attention Span: Fair  Recall:  [employer] of Knowledge:  Fair  Language:  Fair  Akathisia:  No  Handed:  Right  AIMS (if indicated):     Assets:  Communication Skills Desire for Improvement Financial Resources/Insurance Physical Health Resilience Social Support  ADL's:  Intact  Cognition:  WNL  Sleep:  Number of Hours: 5.75     Treatment Plan Summary: Daily contact with patient to assess and evaluate symptoms and progress in treatment and Medication management   Megan Shaffer is a 45 year old female with a history of depression, mood instability and substance abuse admitted for worsening of depression and suicidal ideation in the context of medication noncompliance and relapse on drugs.   1. Suicidal ideation. The patient is able to contract for safety in the hospital.  2. Mood. She was restarted on Seroquel for depression and mood stabilization.   3. Alcohol dependence. She is on Librium taper.  4. Opiate dependence. She relapsed on heroin 5 days  ago.She gets Suboxone treatment for 3 days.  5. Seizure disorder. She was started on Dilantin and high dose Neurontin.  6. Smoking. Nicotine patch is available.  7. Substance abuse treatment. The patient declined residential treatment but will like to resume with Suboxone clinic and AA meetings.   8. Metabolic syndrome monitoring. Lipid profile and TSH are normal. Hemoglobin A1c 5.1.   9. EKG. Normal sinus rhythm. QT 390.  10. Disposition. She will be discharged to home with family. She will follow up with [facility].   Kristine Linea, MD 12/02/2015, 1:35 PM

## 2015-12-02 NOTE — Progress Notes (Signed)
Rapid response called. Patient in great distress holding abdomen and complaining about pain. MD called. Patient discharged to medical. 4 mg IV morphine given. IV Na Bolus administered. Patient stable. A&OX4.

## 2015-12-02 NOTE — BHH Group Notes (Signed)
[facility] Group Notes:  (Nursing/MHT/Case Management/Adjunct)  Date:  12/02/2015  Time:  1:43 AM  Type of Therapy:  Group Therapy  Participation Level:  Active  Participation Quality:  Attentive  Affect:  Tearful  Cognitive:  Alert  Insight:  Appropriate  Engagement in Group:  Limited  Modes of Intervention:  Discussion  Summary of Progress/Problems: Pt stated that her goal was to go home so she could help her son. As pt begin to talk about her son and his drug addiction she started crying and walked away from staff.   Megan Shaffer 12/02/2015, 1:43 AM

## 2015-12-02 NOTE — H&P (Signed)
[facility] [place] [facility]   PATIENT NAME: Megan Shaffer    MR#:  [id number]  DATE OF BIRTH:  05-03-1970  DATE OF ADMISSION:  12/02/2015  PRIMARY CARE PHYSICIAN: No PCP Per Patient   REQUESTING/REFERRING PHYSICIAN: Dr Jennet Maduro  CHIEF COMPLAINT:  Severe abdominal pain  HISTORY OF PRESENT ILLNESS:  Licia Harl  is a 45 y.o. female with a known history of Seizure disorder and pancreatitis. She was down on the psychiatric floor voluntarily for substance abuse. She developed severe pain from the stomach to the back with nausea but no vomiting. Pain is severe in nature she is writhing around in the bed. Feels like her prior pancreatitis. Hospitalist services were contacted after a rapid response on the psychiatry floor was done. Patient was transferred to the medical service. A CT scan of the abdomen was ordered stat without contrast.  PAST MEDICAL HISTORY:   Past Medical History:  Diagnosis Date  . Hepatitis C   . Seizures (HCC)     PAST SURGICAL HISTORY:   Past Surgical History:  Procedure Laterality Date  . ABDOMINAL SURGERY    . CHOLECYSTECTOMY    . OOPHORECTOMY    . OVARIAN CYST SURGERY    . TONSILLECTOMY    . TUBAL LIGATION      SOCIAL HISTORY:   Social History  Substance Use Topics  . Smoking status: Current Every Day Smoker    Packs/day: 1.50    Types: Cigarettes  . Smokeless tobacco: Never Used  . Alcohol use Yes     Comment: Fifth of liquor daily     FAMILY HISTORY:   Family History  Problem Relation Age of Onset  . Cancer Mother   . Healthy Father     DRUG ALLERGIES:   Allergies  Allergen Reactions  . Ciprofloxacin Other (See Comments)    Unknown reaction  . Famotidine Other (See Comments)    Unknown reaction.  Earnestine Leys [Ziprasidone Hcl] Other (See Comments)    Unknown reaction  . Imitrex [Sumatriptan] Other (See Comments)    Unknown reaction  . Toradol [Ketorolac Tromethamine] Other (See Comments)   Unknown reaction  . Zofran [Ondansetron Hcl] Other (See Comments)    Unknown reaction    REVIEW OF SYSTEMS:  CONSTITUTIONAL: No fever. Hot and cold feeling. Positive for sweats. Positive for weight loss. Positive for fatigue. EYES: No blurred or double vision.  EARS, NOSE, AND THROAT: No tinnitus or ear pain. No sore throat RESPIRATORY: No cough, shortness of breath, wheezing or hemoptysis.  CARDIOVASCULAR: No chest pain, orthopnea, edema.  GASTROINTESTINAL: Positive for nausea, and abdominal pain. No blood in bowel movements. No vomiting. States her stool is dark GENITOURINARY: No dysuria, hematuria.  ENDOCRINE: No polyuria, nocturia,  HEMATOLOGY: No anemia, easy bruising or bleeding SKIN: No rash or lesion. MUSCULOSKELETAL: No joint pain or arthritis.   NEUROLOGIC: No tingling, numbness, weakness. Feels dizzy PSYCHIATRY: Positive for anxiety and depression.   MEDICATIONS AT HOME:   Prior to Admission medications   Medication Sig Start Date End Date Taking? Authorizing Provider  ALPRAZolam Prudy Feeler) 1 MG tablet Take 1 mg by mouth 3 (three) times daily as needed for anxiety.    Historical Provider, MD  diazepam (VALIUM) 10 MG tablet Take 10 mg by mouth 3 (three) times daily as needed for anxiety (seizures).    Historical Provider, MD  gabapentin (NEURONTIN) 600 MG tablet Take 1,800 mg by mouth 2 (two) times daily.    Historical Provider, MD  phenytoin (  DILANTIN) 100 MG ER capsule Take 300 mg by mouth at bedtime.     Historical Provider, MD      VITAL SIGNS:  Blood pressure (!) 144/79, temperature 99.6 F (37.6 C), temperature source Oral, resp. rate (!) 26, last menstrual period 11/20/2015, SpO2 98 %.  PHYSICAL EXAMINATION:  GENERAL:  45 y.o.-year-old patient lying in the bed with With acute distress. EYES: Pupils equal, round, reactive to light and accommodation. No scleral icterus. Extraocular muscles intact.  HEENT: Head atraumatic, normocephalic. Oropharynx and nasopharynx  clear.  NECK:  Supple, no jugular venous distention. No thyroid enlargement, no tenderness.  LUNGS: Normal breath sounds bilaterally, no wheezing, rales,rhonchi or crepitation. No use of accessory muscles of respiration.  CARDIOVASCULAR: S1, S2 normal. No murmurs, rubs, or gallops.  ABDOMEN: Soft, tense abdomen and very tender in the epigastric area, nondistended. Bowel sounds present. No organomegaly or mass.  EXTREMITIES: No pedal edema, cyanosis, or clubbing.  NEUROLOGIC: Cranial nerves II through XII are intact. Muscle strength 5/5 in all extremities. Sensation intact. Gait not checked.  PSYCHIATRIC: The patient is alert and oriented x 3.  SKIN: No rash, lesion, or ulcer.   LABORATORY PANEL:   CBC  Recent Labs Lab 12/02/15 2016  WBC 12.6*  HGB 13.1  HCT 39.1  PLT 311   ------------------------------------------------------------------------------------------------------------------  Chemistries   Recent Labs Lab 11/27/15 1632 12/02/15 2016  NA 138 136  K 3.4* 4.5  CL 105 103  CO2 24 24  GLUCOSE 102* 92  BUN <5* 11  CREATININE 0.61 0.63  CALCIUM 8.9 8.8*  AST 32  --   ALT 24  --   ALKPHOS 84  --   BILITOT 0.3  --    ------------------------------------------------------------------------------------------------------------------   IMPRESSION AND PLAN:   1. Severe abdominal pain. CT abdomen without contrast stat to rule out free air since the abdomen is tense on exam. I will put on Protonix drip. Pain medications with morphine and Percocet when necessary. May end up needing surgical consultation. Lipase and amylase ordered. 2. Tobacco abuse smoking cessation needed. Nicotine patch ordered. 3. Alcohol abuse and heroin and cocaine abuse. Must stop drugs. 4. History of seizure continue Dilantin 5. Bipolar disorder on Seroquel.   All the records are reviewed and case discussed with ED provider. Management plans discussed with the patient, family and they are in  agreement.  CODE STATUS: Full code  TOTAL TIME TAKING CARE OF THIS PATIENT: 50 minutes.    Alford HighlandWIETING, Meygan Kyser M.D on 12/02/2015 at 9:09 PM  Between 7am to 6pm - Pager - [phone]  After 6pm call admission pager [phone]  [facility] Office  [phone]  CC: Primary care physician; No PCP Per Patient

## 2015-12-02 NOTE — [facility] Group Notes (Signed)
[facility] Group Notes:  (Nursing/MHT/Case Management/Adjunct)  Date:  12/02/2015  Time:  4:59 PM  Type of Therapy:  Psychoeducational Skills  Participation Level:  Active  Participation Quality:  Appropriate, Attentive and Supportive  Affect:  Appropriate  Cognitive:  Appropriate  Insight:  Appropriate  Engagement in Group:  Engaged, Monopolizing and Supportive  Modes of Intervention:  Discussion and Education  Summary of Progress/Problems:  Megan Shaffer 12/02/2015, 4:59 PM

## 2015-12-02 NOTE — Care Management Utilization Note (Signed)
   Per State Regulation 482.30  This chart was reviewed for necessity with respect to the patient's Admission/ Duration of stay.  Next review date: 12/06/15  Nicolasa Duckingrystal Morrison RN, BSN

## 2015-12-02 NOTE — Plan of Care (Signed)
Problem: Safety: Goal: Ability to remain free from injury will improve Outcome: Progressing Patient has remained free from harm during this shift.    

## 2015-12-02 NOTE — Progress Notes (Signed)
Patient rated her depression and anxiety 7/10./Verbalized that she gets suicidal thoughts at times.Contracts for safety.Patient is too anxious about staying away from drugs once discharged.Compliant with medications.Appropriate with staff & peers.Attended groups.Support & encouragement given.Safety maintained.

## 2015-12-02 NOTE — Progress Notes (Signed)
Responded to Rapid Response. Pt on Room air with O2 saturation of 99%. No services needed by respiratory. Team asked to call should they need respiratory services.

## 2015-12-02 NOTE — Plan of Care (Signed)
Problem: [facility] Participation in Recreation Therapeutic Interventions Goal: STG-Patient will identify at least five coping skills for ** STG: Coping Skills - Within 4 treatment sessions, patient will verbalize at least 5 coping skills for substance abuse in each of 2 treatment sessions to decrease substance abuse post d/c. Outcome: Progressing Treatment Session 1; Completed 1 out of 2: At approximately 12:35 pm, LRT met with patient in craft room. Patient verbalized 5 coping skills for substance abuse. LRT educated patient on leisure and why it is important to implement it into her schedule. LRT educated and provided patient with blank schedules to help her plan her day and try to avoid using substances. LRT educated patient on healthy support systems.  Leonette Monarch, LRT/CTRS 09.08.17 3:43 pm Goal: STG-Other Recreation Therapy Goal (Specify) STG: Stress Management - Within 4 treatment sessions, patient will verbalize understanding of the stress management techniques in each of 2 treatment sessions to increase stress management skills post d/c. Outcome: Progressing Treatment Session 1; Completed 1 out of 2: At approximately 12:35 pm, LRT met with patient in craft room. LRT educated and provided patient with handouts on stress management techniques. Patient verbalized understanding. LRT encouraged patient to read over and practice the stress management techniques.  Leonette Monarch, LRT/CTRS 09.08.17 3:45 pm

## 2015-12-02 NOTE — Progress Notes (Signed)
[facility] CT abd, pelvis and chest. Explained emergent nature and rad tech states she will see if she can have a radiologist pull it and read it.

## 2015-12-02 NOTE — Discharge Summary (Signed)
Physician Discharge Summary Note  Patient:  Megan Shaffer is an 45 y.o., female MRN:  [id number] DOB:  08/11/1970 Patient phone:  [phone] (home)  Patient address:   [address] [place] [id number],  Total Time spent with patient: 15 minutes  Date of Admission:  11/28/2015 Date of Discharge: 12/02/2015  Reason for Admission:  Suicidal ideation.  Identifying data. Megan Shaffer is a 45 year old female, depression, mood instability, and substance abuse.  Chief complaint. "I'm so ashamed of myself."  History of present illness. Information was obtained from the patient and the chart. The patient has long history of bipolar illness and substance use including alcohol, cocaine and heroine. He had been clean of cocaine for the past 3 years while [place] [facility] [place] [place] [facility] [place] and taking Seroquel for bipolar disorder. She was recently released from jail after 120 days there. She was not given any medications jail. Following release she moved in with her in 5 days ago relapsed on heroin. There are multiple needle marks on her body. Without medication and the patient became increasingly depressed with poor sleep, decreased appetite, anhedonia, being of guilt and hopelessness worthlessness, poor energy and concentration, crying spells, social isolation. Following relapse she became even more depressed and ashamed of herself. She became suicidal with a plan to overdose. 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 She also reports heightened anxiety with PTSD type symptoms from past abuse. She admits to drinking a bottle of liquor lately, using heroine and cocaine. She was brought to the hospital by her neighbor witnessed her having seizures. The patient has history of seizure disorder and has been maintained on a combination of Dilantin and Neurontin. She does not believe that her seizures are related to alcohol withdrawal. She does not have a driver's  license because of seizures.  Past psychiatric history. She was hospitalized before for substance abuse as well as depression. 6 years ago she attempted suicide by medication overdose. She had been tried on multiple medications in the past but believes that Seroquel works best for her. She reports maintaining sobriety while on Suboxone.  Family psychiatric history. Multiple family members with depression and anxiety. Brother with heroine addiction.  Social history. She is disabled from bipolar disorder. She believes that she could go back with her aunt in [place] following discharge. She is not interested in residential substance abuse treatment and would like to follow up with Suboxone clinic and AA. As she denies any current legal charges.  Principal Problem: Bipolar 2 disorder, major depressive episode [facility](HCC) Discharge Diagnoses: Patient Active Problem List   Diagnosis Date Noted  . Abdominal pain [R10.9] 12/02/2015  . Bipolar 2 disorder, major depressive episode (HCC) [F31.81] 11/29/2015  . Opioid use disorder, moderate, dependence (HCC) [F11.20] 11/29/2015  . Alcohol use disorder, moderate, dependence (HCC) [F10.20] 11/28/2015  . Seizure disorder (HCC) [G40.909] 11/28/2015  . Cocaine use disorder, moderate, dependence (HCC) [F14.20] 11/28/2015  . Substance induced mood disorder (HCC) [F19.94] 11/28/2015  . 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 Tobacco use disorder [F17.200] 11/28/2015   Past Medical History:  Past Medical History:  Diagnosis Date  . Hepatitis C   . Seizures (HCC)     Past Surgical History:  Procedure Laterality Date  . ABDOMINAL SURGERY    . CHOLECYSTECTOMY    . OOPHORECTOMY    . OVARIAN CYST SURGERY    . TONSILLECTOMY    . TUBAL LIGATION  Family History:  Family History  Problem Relation Age of Onset  . Cancer Mother   . Healthy Father    Social History:  History  Alcohol Use  . Yes    Comment: Fifth of liquor daily       History  Drug Use  . Types: IV, Cocaine, Heroin    Comment: also Heroin    Social History   Social History  . Marital status: Divorced    Spouse name: N/A  . Number of children: N/A  . Years of education: N/A   Social History Main Topics  . Smoking status: Current Every Day Smoker    Packs/day: 1.50    Types: Cigarettes  . Smokeless tobacco: Never Used  . Alcohol use Yes     Comment: Fifth of liquor daily   . Drug use:     Types: IV, Cocaine, Heroin     Comment: also Heroin  . Sexual activity: Not Asked   Other Topics Concern  . None   Social History Narrative  . None    Hospital Course:    Megan Shaffer is a 45 year old female with a history of depression, mood instability and substance abuse admitted for worsening of depression and suicidal ideation in the context of medication noncompliance and relapse on drugs.   1. Suicidal ideation. This has resolved. The patient is able to contract for safety. She is forward thinking and optimistic about the future.   2. Mood. She was restarted on Seroquel for depression and mood stabilization.   3. Alcohol dependence. She completed Librium taper.  4. Opiate dependence. She relapsed on heroin 5 days prior to admission. We gave brief Suboxone treatment.   5. Seizure disorder. She was restarted on Dilantin and high dose Neurontin.  6. Smoking. Nicotine patch was available.  7. Substance abuse treatment. The patient declined residential treatment but would like to resume with Suboxone clinic and AA meetings.   8. Metabolic syndrome monitoring. Lipid profile, TSH, and HgbA1C were normal. Hemoglobin A1c are pending.   9. EKG. Normal sinus rhythm. QT 390.  10. Abdominal pain. The patient develop sudden onset of abdominal pain suggestive of pancreatitis. She was transferred to medical floor. Medicine input is greatly appreciated.  11. Disposition. She willmost likely go to the homeless shelter. She will follow up with  [facility].    Physical Findings: AIMS: Facial and Oral Movements Muscles of Facial Expression: None, normal Lips and Perioral Area: None, normal Jaw: None, normal Tongue: None, normal,Extremity Movements Upper (arms, wrists, hands, fingers): None, normal Lower (legs, knees, ankles, toes): None, normal, Trunk Movements Neck, shoulders, hips: None, normal, Overall Severity Severity of abnormal movements (highest score from questions above): None, normal Incapacitation due to abnormal movements: None, normal Patient's awareness of abnormal movements (rate only patient's report): No Awareness, Dental Status Current problems with teeth and/or dentures?: No (dental caries, missing teeth) Does patient usually wear dentures?: No  CIWA:  CIWA-Ar Total: 1 COWS:     Musculoskeletal: Strength & Muscle Tone: within normal limits Gait & Station: normal Patient leans: N/A  Psychiatric Specialty Exam: Physical Exam  Nursing note and vitals reviewed.   Review of Systems  Gastrointestinal: Positive for abdominal pain.  Psychiatric/Behavioral: Positive for substance abuse.  All other systems reviewed and are negative.   Blood pressure (!) 144/79, pulse (!) 107, temperature 99.4 F (37.4 C), temperature source Oral, resp. rate (!) 26, height 5\' 7"  (1.702 m), weight 68.9 kg (152 lb), last menstrual period  11/20/2015, SpO2 98 %.Body mass index is 23.81 kg/m.  See SRA.                                                  Sleep:  Number of Hours: 5.75     Have you used any form of tobacco in the last 30 days? (Cigarettes, Smokeless Tobacco, Cigars, and/or Pipes): Yes  Has this patient used any form of tobacco in the last 30 days? (Cigarettes, Smokeless Tobacco, Cigars, and/or Pipes) Yes, Yes, A prescription for an FDA-approved tobacco cessation medication was offered at discharge and the patient refused  Blood Alcohol level:  Lab Results  Component Value Date    ETH 23 (H) 11/27/2015   ETH  07/30/2008    <5        LOWEST DETECTABLE LIMIT FOR SERUM ALCOHOL IS 5 mg/dL FOR MEDICAL PURPOSES ONLY    Metabolic Disorder Labs:  Lab Results  Component Value Date   HGBA1C 5.1 11/29/2015   No results found for: PROLACTIN Lab Results  Component Value Date   CHOL 120 11/29/2015   TRIG 141 11/29/2015   HDL 37 (L) 11/29/2015   CHOLHDL 3.2 11/29/2015   VLDL 28 11/29/2015   LDLCALC 55 11/29/2015    See Psychiatric Specialty Exam and Suicide Risk Assessment completed by Attending Physician prior to discharge.  Discharge destination:  Other:  medical floor.  Is patient on multiple antipsychotic therapies at discharge:  No   Has Patient had three or more failed trials of antipsychotic monotherapy by history:  No  Recommended Plan for Multiple Antipsychotic Therapies: NA  Discharge Instructions    Diet - low sodium heart healthy    Complete by:  As directed   Increase activity slowly    Complete by:  As directed       Medication List    STOP taking these medications   ALPRAZolam 1 MG tablet Commonly known as:  XANAX   diazepam 10 MG tablet Commonly known as:  VALIUM     TAKE these medications     Indication  gabapentin 600 MG tablet Commonly known as:  NEURONTIN Take 1,800 mg by mouth 2 (two) times daily.  Indication:  Simple Seizure   phenytoin 100 MG ER capsule Commonly known as:  DILANTIN Take 300 mg by mouth at bedtime.  Indication:  Tonic-Clonic Seizures      Follow-up Information    [facility] Follow up on 12/05/2015.   Why:  Please arrive during Walk-in hours M-F 9a-4p. Please bring photo ID and discharge summary. This agency will provide outpatient therapy and medication management. Suboxone maintenance treatment will be determine after initial assessment.  Contact information: [address] [place], [address] [place] [phone]          Follow-up recommendations:   Activity:  as tolerated. Diet:  low sodium heart healthy. Other:  keep follow up appointments.  Comments:    Signed: Kristine Linea, MD 12/02/2015, 9:25 PM

## 2015-12-03 DIAGNOSIS — R1084 Generalized abdominal pain: Secondary | ICD-10-CM | POA: Diagnosis not present

## 2015-12-03 LAB — CBC
HEMATOCRIT: 38.8 % (ref 35.0–47.0)
Hemoglobin: 13.6 g/dL (ref 12.0–16.0)
MCH: 28.9 pg (ref 26.0–34.0)
MCHC: 35 g/dL (ref 32.0–36.0)
MCV: 82.6 fL (ref 80.0–100.0)
Platelets: 239 10*3/uL (ref 150–440)
RBC: 4.7 MIL/uL (ref 3.80–5.20)
RDW: 12.8 % (ref 11.5–14.5)
WBC: 9.3 10*3/uL (ref 3.6–11.0)

## 2015-12-03 LAB — BASIC METABOLIC PANEL
Anion gap: 5 (ref 5–15)
BUN: 10 mg/dL (ref 6–20)
CHLORIDE: 105 mmol/L (ref 101–111)
CO2: 28 mmol/L (ref 22–32)
Calcium: 8.2 mg/dL — ABNORMAL LOW (ref 8.9–10.3)
Creatinine, Ser: 0.54 mg/dL (ref 0.44–1.00)
GFR calc non Af Amer: >60 mL/min (ref >60)
Glucose, Bld: 94 mg/dL (ref 65–99)
POTASSIUM: 4.2 mmol/L (ref 3.5–5.1)
SODIUM: 138 mmol/L (ref 135–145)

## 2015-12-03 MED ORDER — PANTOPRAZOLE SODIUM 40 MG PO TBEC
40.0000 mg | DELAYED_RELEASE_TABLET | Freq: Every day | ORAL | Status: DC
Start: 2015-12-03 — End: 2015-12-04
  Administered 2015-12-03: 40 mg via ORAL
  Filled 2015-12-03: qty 1

## 2015-12-03 MED ORDER — PROMETHAZINE HCL 12.5 MG PO TABS: 12.5000 mg | ORAL_TABLET | Freq: Four times a day (QID) | ORAL | 0 refills | Status: DC | PRN

## 2015-12-03 MED ORDER — PROMETHAZINE HCL 25 MG PO TABS
12.5000 mg | ORAL_TABLET | Freq: Four times a day (QID) | ORAL | Status: DC | PRN
  Administered 2015-12-03: 25 mg via ORAL
  Filled 2015-12-03: qty 1

## 2015-12-03 MED ORDER — PANTOPRAZOLE SODIUM 40 MG PO TBEC: 40.0000 mg | DELAYED_RELEASE_TABLET | Freq: Every day | ORAL | 0 refills | Status: DC

## 2015-12-03 NOTE — Progress Notes (Signed)
Notified by central tele of 5 beat run of V tach.  Pt is nonsymptomatic, NSR 86 at this time.  Dr Imogene Burnhen notified.  Will continue to monitor.

## 2015-12-03 NOTE — Consult Note (Signed)
  Ms. Loleta ChanceHill will be transferred to psychiatry when bed is available. I will put admission orders in.

## 2015-12-03 NOTE — Tx Team (Signed)
Interdisciplinary Treatment and Diagnostic Plan Update  12/03/2015 (Late Entry from 12/02/15) Time of Session: 10:30am Megan Shaffer MRN: [id number]  Principal Diagnosis: Bipolar 2 disorder, major depressive episode (HCC)  Secondary Diagnoses: Principal Problem:   Bipolar 2 disorder, major depressive episode (HCC) Active Problems:   Alcohol use disorder, moderate, dependence (HCC)   Seizure disorder (HCC)   Cocaine use disorder, moderate, dependence (HCC)   Substance induced mood disorder (HCC)   Alcohol hallucinosis (HCC)   Tobacco use disorder   Opioid use disorder, moderate, dependence (HCC)   Current Medications:  No current facility-administered medications for this encounter.    Current Outpatient Prescriptions  Medication Sig Dispense Refill  . pantoprazole (PROTONIX) 40 MG tablet Take 1 tablet (40 mg total) by mouth daily. 30 tablet 0   Facility-Administered Medications Ordered in Other Encounters  Medication Dose Route Frequency Provider Last Rate Last Dose  . 0.9 %  sodium chloride infusion   Intravenous Continuous Enedina Finner, MD 50 mL/hr at 12/03/15 1101    . acetaminophen (TYLENOL) tablet 650 mg  650 mg Oral Q6H PRN Alford Highland, MD       Or  . acetaminophen (TYLENOL) suppository 650 mg  650 mg Rectal Q6H PRN Alford Highland, MD      . ALPRAZolam Prudy Feeler) tablet 1 mg  1 mg Oral TID PRN Alford Highland, MD      . gabapentin (NEURONTIN) tablet 1,800 mg  1,800 mg Oral BID Alford Highland, MD   1,800 mg at 12/03/15 1031  . HYDROcodone-acetaminophen (NORCO/VICODIN) 5-325 MG per tablet 1 tablet  1 tablet Oral Q4H PRN Alford Highland, MD   1 tablet at 12/03/15 1405  . nicotine (NICODERM CQ - dosed in mg/24 hours) patch 21 mg  21 mg Transdermal Daily Alford Highland, MD   21 mg at 12/03/15 0947  . pantoprazole (PROTONIX) EC tablet 40 mg  40 mg Oral Daily Enedina Finner, MD   40 mg at 12/03/15 1308  . phenytoin (DILANTIN) ER capsule 300 mg  300 mg Oral QHS Alford Highland, MD   300  mg at 12/02/15 2301  . promethazine (PHENERGAN) injection 25 mg  25 mg Intravenous Q4H PRN Earline Mayotte, MD   25 mg at 12/03/15 1523  . QUEtiapine (SEROQUEL) tablet 300 mg  300 mg Oral QHS Alford Highland, MD   300 mg at 12/02/15 2302  . sodium chloride flush (NS) 0.9 % injection 3 mL  3 mL Intravenous Q12H Alford Highland, MD   3 mL at 12/03/15 1000   PTA Medications: Prescriptions Prior to Admission  Medication Sig Dispense Refill Last Dose  . ALPRAZolam (XANAX) 1 MG tablet Take 1 mg by mouth 3 (three) times daily as needed for anxiety.     . diazepam (VALIUM) 10 MG tablet Take 10 mg by mouth 3 (three) times daily as needed for anxiety (seizures).     . gabapentin (NEURONTIN) 600 MG tablet Take 1,800 mg by mouth 2 (two) times daily.     . phenytoin (DILANTIN) 100 MG ER capsule Take 300 mg by mouth at bedtime.        Treatment Modalities: Medication Management, Group therapy, Case management,  1 to 1 session with clinician, Psychoeducation, Recreational therapy.   Physician Treatment Plan for Primary Diagnosis: Bipolar 2 disorder, major depressive episode (HCC) Long Term Goal(s): Improvement in symptoms so as ready for discharge   Short Term Goals: Ability to identify changes in lifestyle to reduce recurrence of condition will improve, Ability to  demonstrate self-control will improve, Ability to identify and develop effective coping behaviors will improve, Ability to maintain clinical measurements within normal limits will improve, Compliance with prescribed medications will improve and Ability to identify triggers associated with substance abuse/mental health issues will improve  Medication Management: Evaluate patient's response, side effects, and tolerance of medication regimen.  Therapeutic Interventions: 1 to 1 sessions, Unit Group sessions and Medication administration.  Evaluation of Outcomes: Adequate for Discharge  Physician Treatment Plan for Secondary Diagnosis:  Principal Problem:   Bipolar 2 disorder, major depressive episode (HCC) Active Problems:   Alcohol use disorder, moderate, dependence (HCC)   Seizure disorder (HCC)   Cocaine use disorder, moderate, dependence (HCC)   Substance induced mood disorder (HCC)   Alcohol hallucinosis (HCC)   Tobacco use disorder   Opioid use disorder, moderate, dependence (HCC)  Long Term Goal(s): Improvement in symptoms so as ready for discharge  Short Term Goals: Ability to identify changes in lifestyle to reduce recurrence of condition will improve, Ability to demonstrate self-control will improve, Ability to identify and develop effective coping behaviors will improve, Ability to maintain clinical measurements within normal limits will improve, Compliance with prescribed medications will improve and Ability to identify triggers associated with substance abuse/mental health issues will improve  Medication Management: Evaluate patient's response, side effects, and tolerance of medication regimen.  Therapeutic Interventions: 1 to 1 sessions, Unit Group sessions and Medication administration.  Evaluation of Outcomes: Adequate for Discharge   RN Treatment Plan for Primary Diagnosis: Bipolar 2 disorder, major depressive episode (HCC) Long Term Goal(s): Knowledge of disease and therapeutic regimen to maintain health will improve  Short Term Goals:  Ability to verbalize frustration and anger appropriately will improve, Ability to demonstrate self-control and Ability to participate in decision making will improve  Medication Management: RN will administer medications as ordered by provider, will assess and evaluate patient's response and provide education to patient for prescribed medication. RN will report any adverse and/or side effects to prescribing provider.  Therapeutic Interventions: 1 on 1 counseling sessions, Psychoeducation, Medication administration, Evaluate responses to treatment, Monitor vital signs  and CBGs as ordered, Perform/monitor CIWA, COWS, AIMS and Fall Risk screenings as ordered, Perform wound care treatments as ordered.  Evaluation of Outcomes: Adequate for Discharge   LCSW Treatment Plan for Primary Diagnosis: Bipolar 2 disorder, major depressive episode (HCC) Long Term Goal(s): Safe transition to appropriate next level of care at discharge, Engage patient in therapeutic group addressing interpersonal concerns.  Short Term Goals: Engage patient in aftercare planning with referrals and resources, Increase emotional regulation, Facilitate acceptance of mental health diagnosis and concerns, Facilitate patient progression through stages of change regarding substance use diagnoses and concerns, Identify triggers associated with mental health/substance abuse issues and Increase skills for wellness and recovery  Therapeutic Interventions: Assess for all discharge needs, 1 to 1 time with Social worker, Explore available resources and support systems, Assess for adequacy in community support network, Educate family and significant other(s) on suicide prevention, Complete Psychosocial Assessment, Interpersonal group therapy.  Evaluation of Outcomes: Adequate for Discharge   Progress in Treatment: Attending groups: Yes. Participating in groups: Yes. Taking medication as prescribed: Yes. Toleration medication: Yes. Family/Significant other contact made: No, Patient refused for family/support contact.  Patient understands diagnosis: Yes. Discussing patient identified problems/goals with staff: Yes. Medical problems stabilized or resolved: Yes. Denies suicidal/homicidal ideation: Yes. Issues/concerns per patient self-inventory: No. Other: n/a  New problem(s) identified: None identified.  New Short Term/Long Term Goal(s): None identified  Discharge  Plan or Barriers: [facility]   Reason for Continuation of Hospitalization: Discharged  12/02/2015  Estimated Length of Stay: Discharge 12/02/15  Attendees: Patient: 12/03/2015 4:10 PM  Physician: Dr. Jennet MaduroPucilowska, MD 12/03/2015 4:10 PM  Nursing: Shelia MediaJanet Jones, RN 12/03/2015 4:10 PM  RN Care Manager: 12/03/2015 4:10 PM  Social Worker: Fredrich BirksAmaris G. Garnette CzechSampson MSW, LCSWA 12/03/2015 4:10 PM  Recreational Therapist:  12/03/2015 4:10 PM  Other:  12/03/2015 4:10 PM  Other:  12/03/2015 4:10 PM  Other: 12/03/2015 4:10 PM    Scribe for Treatment Team: Arelia LongestAmaris G Rafan Sanders, LCSWA 12/03/2015 4:14 PM

## 2015-12-03 NOTE — Care Management Obs Status (Signed)
MEDICARE OBSERVATION STATUS NOTIFICATION   Patient Details  Name: Megan Shaffer MRN: [id number] Date of Birth: 01/17/1971   Medicare Observation Status Notification Given:  No (discharge order in less than 24 hours / abdominal pain)    Caren MacadamMichelle Jaydon Avina, RN 12/03/2015, 12:11 PM

## 2015-12-03 NOTE — Discharge Summary (Signed)
[facility] - [place] [facility]   PATIENT NAME: Megan Shaffer    MR#:  [id number]  DATE OF BIRTH:  1971-01-28  DATE OF ADMISSION:  12/02/2015 ADMITTING PHYSICIAN: Alford Highland, MD  DATE OF DISCHARGE: 12/03/15  PRIMARY CARE PHYSICIAN: No PCP Per Patient    ADMISSION DIAGNOSIS:  acute pancreatitis abdominal pain  DISCHARGE DIAGNOSIS:  Abdominal pain (subjective pain with negative w/u)-unclear etiology H/o substance abuse (heroin,cocaine,etoh) SECONDARY DIAGNOSIS:   Past Medical History:  Diagnosis Date  . Hepatitis C   . Seizures (HCC)     HOSPITAL COURSE:   1. Generalized abdominal pain. - CT abdomen without contrast negative -Lipase, LFT's, and other labs norma; -pt has subjective symptoms with negative w/u -?seeking pain meds (given her past history) -tells me she is hungry and very thirsty and wants to eat. Her exam is unremarkable -spoke with Dr Tonita Cong -ok to start diet  -d/c IVF,IV pain meds  -po protonix  2. Tobacco abuse smoking cessation needed. Nicotine patch ordered. 3. Alcohol abuse and heroin and cocaine abuse. Must stop drugs. 4. History of seizure continue Dilantin 5. Bipolar disorder on Seroquel.  Spoke with Dr Jennet Maduro Pt will transfer back to [facility] once bed opens up today  CONSULTS OBTAINED:  Treatment Team:  Earline Mayotte, MD Enedina Finner, MD Ricarda Frame, MD  DRUG ALLERGIES:   Allergies  Allergen Reactions  . Ciprofloxacin Other (See Comments)    Unknown reaction  . Famotidine Other (See Comments)    Unknown reaction.  Earnestine Leys [Ziprasidone Hcl] Other (See Comments)    Unknown reaction  . Imitrex [Sumatriptan] Other (See Comments)    Unknown reaction  . Toradol [Ketorolac Tromethamine] Other (See Comments)    Unknown reaction  . Zofran [Ondansetron Hcl] Other (See Comments)    Unknown reaction    DISCHARGE MEDICATIONS:   Current Discharge Medication List    START taking these medications    Details  pantoprazole (PROTONIX) 40 MG tablet Take 1 tablet (40 mg total) by mouth daily. Qty: 30 tablet, Refills: 0      CONTINUE these medications which have NOT CHANGED   Details  ALPRAZolam (XANAX) 1 MG tablet Take 1 mg by mouth 3 (three) times daily as needed for anxiety.    diazepam (VALIUM) 10 MG tablet Take 10 mg by mouth 3 (three) times daily as needed for anxiety (seizures).    gabapentin (NEURONTIN) 600 MG tablet Take 1,800 mg by mouth 2 (two) times daily.    phenytoin (DILANTIN) 100 MG ER capsule Take 300 mg by mouth at bedtime.         If you experience worsening of your admission symptoms, develop shortness of breath, life threatening emergency, suicidal or homicidal thoughts you must seek medical attention immediately by calling 911 or calling your MD immediately  if symptoms less severe.  You Must read complete instructions/literature along with all the possible adverse reactions/side effects for all the Medicines you take and that have been prescribed to you. Take any new Medicines after you have completely understood and accept all the possible adverse reactions/side effects.   Please note  You were cared for by a hospitalist during your hospital stay. If you have any questions about your discharge medications or the care you received while you were in the hospital after you are discharged, you can call the unit and asked to speak with the hospitalist on call if the hospitalist that took care of you is not available. Once  you are discharged, your primary care physician will handle any further medical issues. Please note that NO REFILLS for any discharge medications will be authorized once you are discharged, as it is imperative that you return to your primary care physician (or establish a relationship with a primary care physician if you do not have one) for your aftercare needs so that they can reassess your need for medications and monitor your lab values. Today    SUBJECTIVE   I am very thirsty. C/o abd pain but then gets distracted with other social issues at home. Appears resting comfortably Had 2 BM's yday-regular  VITAL SIGNS:  Blood pressure (!) 120/59, pulse 75, temperature 97.6 F (36.4 C), temperature source Oral, resp. rate 17, height 5\' 9"  (1.753 m), weight 73 kg (161 lb), last menstrual period 11/20/2015, SpO2 100 %.  I/O:   Intake/Output Summary (Last 24 hours) at 12/03/15 1103 Last data filed at 12/03/15 0800  Gross per 24 hour  Intake          1966.61 ml  Output             2650 ml  Net          -683.39 ml    PHYSICAL EXAMINATION:  GENERAL:  45 y.o.-year-old patient lying in the bed with no acute distress.  EYES: Pupils equal, round, reactive to light and accommodation. No scleral icterus. Extraocular muscles intact.  HEENT: Head atraumatic, normocephalic. Oropharynx and nasopharynx clear.  NECK:  Supple, no jugular venous distention. No thyroid enlargement, no tenderness.  LUNGS: Normal breath sounds bilaterally, no wheezing, rales,rhonchi or crepitation. No use of accessory muscles of respiration.  CARDIOVASCULAR: S1, S2 normal. No murmurs, rubs, or gallops.  ABDOMEN: Soft, non-tender, non-distended. Bowel sounds present. No organomegaly or mass. No guarding or rigidity EXTREMITIES: No pedal edema, cyanosis, or clubbing.  NEUROLOGIC: Cranial nerves II through XII are intact. Muscle strength 5/5 in all extremities. Sensation intact. Gait not checked.  PSYCHIATRIC: The patient is alert and oriented x 3.  SKIN: No obvious rash, lesion, or ulcer.   DATA REVIEW:   CBC   Recent Labs Lab 12/03/15 0540  WBC 9.3  HGB 13.6  HCT 38.8  PLT 239    Chemistries   Recent Labs Lab 12/02/15 2016 12/03/15 0540  NA 136 138  K 4.5 4.2  CL 103 105  CO2 24 28  GLUCOSE 92 94  BUN 11 10  CREATININE 0.63 0.54  CALCIUM 8.8* 8.2*  AST 32  --   ALT 22  --   ALKPHOS 77  --   BILITOT 0.1*  --     Microbiology Results    No results found for this or any previous visit (from the past 240 hour(s)).  RADIOLOGY:  Ct Abdomen Pelvis Wo Contrast  Result Date: 12/02/2015 CLINICAL DATA:  45 year old female with generalized chest pain and abdominal pain. History of hepatitis-C. EXAM: CT CHEST, ABDOMEN AND PELVIS WITHOUT CONTRAST TECHNIQUE: Multidetector CT imaging of the chest, abdomen and pelvis was performed following the standard protocol without IV contrast. COMPARISON:  CT dated 04/14/11 FINDINGS: Evaluation of this exam is limited in the absence of intravenous contrast. Evaluation is also somewhat limited due to respiratory motion artifact. CT CHEST FINDINGS There is a 4 mm right lower lobe nodule (Series 2 image 48), stable since study dated 2013. A 9 mm nodular density along the left fissure (series 2, image 31) also appears similar to prior study and may represent scarring. The lungs are  clear. There is no pleural effusion or pneumothorax. The central airways are patent. There is no aneurysmal dilatation of the thoracic aorta. The central pulmonary arteries are grossly unremarkable on this noncontrast study. There is no cardiomegaly or pericardial effusion. No hilar or mediastinal adenopathy noted. The esophagus is grossly unremarkable. No thyroid nodules identified. There is no axillary adenopathy. The chest wall soft tissues appear unremarkable. The osseous structures are intact. CT ABDOMEN PELVIS FINDINGS No intra-abdominal free air or free fluid. Cholecystectomy. The liver, pancreas, spleen, adrenal glands, kidneys, visualized ureters, and urinary bladder appear unremarkable. The uterus is anteverted and grossly unremarkable. The ovaries appear grossly unremarkable as well. Constipation. There is no evidence of bowel obstruction or active inflammation. Normal appendix. The abdominal aorta and IVC appear grossly unremarkable on this noncontrast study. No portal venous gas identified. There is no adenopathy. The abdominal  wall soft tissues appear unremarkable. The osseous structures are intact. IMPRESSION: No acute intrathoracic, abdominal, or pelvic pathology. Electronically Signed   By: Elgie Collard M.D.   On: 12/02/2015 21:26   Ct Chest Wo Contrast  Result Date: 12/02/2015 CLINICAL DATA:  45 year old female with generalized chest pain and abdominal pain. History of hepatitis-C. EXAM: CT CHEST, ABDOMEN AND PELVIS WITHOUT CONTRAST TECHNIQUE: Multidetector CT imaging of the chest, abdomen and pelvis was performed following the standard protocol without IV contrast. COMPARISON:  CT dated 04/14/11 FINDINGS: Evaluation of this exam is limited in the absence of intravenous contrast. Evaluation is also somewhat limited due to respiratory motion artifact. CT CHEST FINDINGS There is a 4 mm right lower lobe nodule (Series 2 image 48), stable since study dated 2013. A 9 mm nodular density along the left fissure (series 2, image 31) also appears similar to prior study and may represent scarring. The lungs are clear. There is no pleural effusion or pneumothorax. The central airways are patent. There is no aneurysmal dilatation of the thoracic aorta. The central pulmonary arteries are grossly unremarkable on this noncontrast study. There is no cardiomegaly or pericardial effusion. No hilar or mediastinal adenopathy noted. The esophagus is grossly unremarkable. No thyroid nodules identified. There is no axillary adenopathy. The chest wall soft tissues appear unremarkable. The osseous structures are intact. CT ABDOMEN PELVIS FINDINGS No intra-abdominal free air or free fluid. Cholecystectomy. The liver, pancreas, spleen, adrenal glands, kidneys, visualized ureters, and urinary bladder appear unremarkable. The uterus is anteverted and grossly unremarkable. The ovaries appear grossly unremarkable as well. Constipation. There is no evidence of bowel obstruction or active inflammation. Normal appendix. The abdominal aorta and IVC appear grossly  unremarkable on this noncontrast study. No portal venous gas identified. There is no adenopathy. The abdominal wall soft tissues appear unremarkable. The osseous structures are intact. IMPRESSION: No acute intrathoracic, abdominal, or pelvic pathology. Electronically Signed   By: Elgie Collard M.D.   On: 12/02/2015 21:26     Management plans discussed with the patient, family and they are in agreement.  CODE STATUS:     Code Status Orders        Start     Ordered   12/02/15 2057  Full code  Continuous     12/02/15 2057    Code Status History    Date Active Date Inactive Code Status Order ID Comments User Context   11/28/2015  5:07 PM 12/02/2015  8:33 PM Full Code [id number]  Audery Amel, MD Inpatient      TOTAL TIME TAKING CARE OF THIS PATIENT: 40 minutes.    Alla Sloma  M.D on 12/03/2015 at 11:03 AM  Between 7am to 6pm - Pager - [phone] After 6pm go to [email or web address] - password [email or web address] [facility]  [facility]  Office  [phone]  CC: Primary care physician; No PCP Per Patient

## 2015-12-03 NOTE — Care Management Obs Status (Signed)
MEDICARE OBSERVATION STATUS NOTIFICATION   Patient Details  Name: Megan LarssonShenna M Cheema MRN: [id number] Date of Birth: 03/06/1971   Medicare Observation Status Notification Given:  Yes ( abdominal pain )    Caren MacadamMichelle Emilynn Srinivasan, RN 12/03/2015, 6:41 PM

## 2015-12-03 NOTE — Progress Notes (Signed)
CC: Abdominal pain Subjective: Patient continues to complain of abdominal pain. However she appears to be resting comfortably in bed.  Objective: Vital signs in last 24 hours: Temp:  [97.6 F (36.4 C)-99.6 F (37.6 C)] 97.6 F (36.4 C) (09/09 0509) Pulse Rate:  [69-120] 75 (09/09 0900) Resp:  [17-26] 17 (09/09 0509) BP: (120-144)/(59-79) 120/59 (09/09 0900) SpO2:  [95 %-100 %] 100 % (09/09 0509) Weight:  [73 kg (161 lb)] 73 kg (161 lb) (09/08 2141) Last BM Date: 12/02/15  Intake/Output from previous day: 09/08 0701 - 09/09 0700 In: 1290 [I.V.:1290] Out: 1350 [Urine:1350] Intake/Output this shift: Total I/O In: 676.6 [I.V.:676.6] Out: 1300 [Urine:1300]  Physical exam:  Gen.: No acute distress Chest: Clear to auscultation Heart: Regular rate and rhythm Abdomen: Soft, nontender, nondistended  Lab Results: CBC   Recent Labs  12/02/15 2016 12/03/15 0540  WBC 12.6* 9.3  HGB 13.1 13.6  HCT 39.1 38.8  PLT 311 239   BMET  Recent Labs  12/02/15 2016 12/03/15 0540  NA 136 138  K 4.5 4.2  CL 103 105  CO2 24 28  GLUCOSE 92 94  BUN 11 10  CREATININE 0.63 0.54  CALCIUM 8.8* 8.2*   PT/INR No results for input(s): LABPROT, INR in the last 72 hours. ABG No results for input(s): PHART, HCO3 in the last 72 hours.  Invalid input(s): PCO2, PO2  Studies/Results: Ct Abdomen Pelvis Wo Contrast  Result Date: 12/02/2015 CLINICAL DATA:  45 year old female with generalized chest pain and abdominal pain. History of hepatitis-C. EXAM: CT CHEST, ABDOMEN AND PELVIS WITHOUT CONTRAST TECHNIQUE: Multidetector CT imaging of the chest, abdomen and pelvis was performed following the standard protocol without IV contrast. COMPARISON:  CT dated 04/14/11 FINDINGS: Evaluation of this exam is limited in the absence of intravenous contrast. Evaluation is also somewhat limited due to respiratory motion artifact. CT CHEST FINDINGS There is a 4 mm right lower lobe nodule (Series 2 image 48),  stable since study dated 2013. A 9 mm nodular density along the left fissure (series 2, image 31) also appears similar to prior study and may represent scarring. The lungs are clear. There is no pleural effusion or pneumothorax. The central airways are patent. There is no aneurysmal dilatation of the thoracic aorta. The central pulmonary arteries are grossly unremarkable on this noncontrast study. There is no cardiomegaly or pericardial effusion. No hilar or mediastinal adenopathy noted. The esophagus is grossly unremarkable. No thyroid nodules identified. There is no axillary adenopathy. The chest wall soft tissues appear unremarkable. The osseous structures are intact. CT ABDOMEN PELVIS FINDINGS No intra-abdominal free air or free fluid. Cholecystectomy. The liver, pancreas, spleen, adrenal glands, kidneys, visualized ureters, and urinary bladder appear unremarkable. The uterus is anteverted and grossly unremarkable. The ovaries appear grossly unremarkable as well. Constipation. There is no evidence of bowel obstruction or active inflammation. Normal appendix. The abdominal aorta and IVC appear grossly unremarkable on this noncontrast study. No portal venous gas identified. There is no adenopathy. The abdominal wall soft tissues appear unremarkable. The osseous structures are intact. IMPRESSION: No acute intrathoracic, abdominal, or pelvic pathology. Electronically Signed   By: Elgie CollardArash  Radparvar M.D.   On: 12/02/2015 21:26   Ct Chest Wo Contrast  Result Date: 12/02/2015 CLINICAL DATA:  45 year old female with generalized chest pain and abdominal pain. History of hepatitis-C. EXAM: CT CHEST, ABDOMEN AND PELVIS WITHOUT CONTRAST TECHNIQUE: Multidetector CT imaging of the chest, abdomen and pelvis was performed following the standard protocol without IV contrast. COMPARISON:  CT dated 04/14/11 FINDINGS: Evaluation of this exam is limited in the absence of intravenous contrast. Evaluation is also somewhat limited due  to respiratory motion artifact. CT CHEST FINDINGS There is a 4 mm right lower lobe nodule (Series 2 image 48), stable since study dated 2013. A 9 mm nodular density along the left fissure (series 2, image 31) also appears similar to prior study and may represent scarring. The lungs are clear. There is no pleural effusion or pneumothorax. The central airways are patent. There is no aneurysmal dilatation of the thoracic aorta. The central pulmonary arteries are grossly unremarkable on this noncontrast study. There is no cardiomegaly or pericardial effusion. No hilar or mediastinal adenopathy noted. The esophagus is grossly unremarkable. No thyroid nodules identified. There is no axillary adenopathy. The chest wall soft tissues appear unremarkable. The osseous structures are intact. CT ABDOMEN PELVIS FINDINGS No intra-abdominal free air or free fluid. Cholecystectomy. The liver, pancreas, spleen, adrenal glands, kidneys, visualized ureters, and urinary bladder appear unremarkable. The uterus is anteverted and grossly unremarkable. The ovaries appear grossly unremarkable as well. Constipation. There is no evidence of bowel obstruction or active inflammation. Normal appendix. The abdominal aorta and IVC appear grossly unremarkable on this noncontrast study. No portal venous gas identified. There is no adenopathy. The abdominal wall soft tissues appear unremarkable. The osseous structures are intact. IMPRESSION: No acute intrathoracic, abdominal, or pelvic pathology. Electronically Signed   By: Elgie Collard M.D.   On: 12/02/2015 21:26    Anti-infectives: Anti-infectives    None      Assessment/Plan:  45 year old female with multiple problems including abdominal pain. On exam her abdomen is nontender. If questions she tells she about how bad her pain is but does this without grimacing or obvious secondary signs of pain. Images and labs are all within normal limits. No plans for any surgical intervention at  this time.  Zanna Hawn T. Tonita Cong, MD, FACS  12/03/2015

## 2015-12-03 NOTE — Care Management CC44 (Signed)
Condition Code 44 Documentation Completed  Patient Details  Name: Megan Shaffer MRN: [id number] Date of Birth: 11/18/1970   Condition Code 44 given: yes   Patient signature on Condition Code 44 notice  no Documentation of 2 MD's agreement:   yes Code 44 added to claim:   yes    Caren MacadamMichelle Brynn Reznik, RN 12/03/2015, 4:09 PM

## 2015-12-03 NOTE — Progress Notes (Signed)
  [facility] :  Will you be returning to the same living situation after discharge:  No. At discharge, do you have transportation home?: Yes,  Friend Do you have the ability to pay for your medications: Yes,  Patient has insurance  Release of information consent forms completed and in the chart;  Patient's signature needed at discharge.  Patient to Follow up at: Follow-up Information    [facility] Follow up on 12/05/2015.   Why:  Please arrive during Walk-in hours M-F 9a-4p. Please bring photo ID and discharge summary. This agency will provide outpatient therapy and medication management. Suboxone maintenance treatment will be determine after initial assessment.  Contact information: [address] [place], [id number] Demetrius [phone]- [phone]- [phone]          Next level of care provider [facility] Link:no  Safety Planning and Suicide Prevention discussed: Yes,  discussed with patient due to patient declining consent for family contact  Have you used any form of tobacco in the last 30 days? (Cigarettes, Smokeless Tobacco, Cigars, and/or Pipes): Yes  Has patient been referred to the Quitline?: Patient refused referral  Patient has been referred for addiction treatment: Yes  Bryar Dahms G. Garnette CzechSampson MSW, LCSWA 12/03/2015, 4:09 PM

## 2015-12-03 NOTE — Significant Event (Addendum)
Rapid Response Event Note  Overview: Time Called: 1945 Arrival Time: 1950 Event Type: Other (Comment) (abd pain)  Initial Focused Assessment: [facility] of intense [facility]d pain; pt is laying in fetal position with a pillow held tightly to abd area, crying out in pain. Pt complaining of epigastric pain 10/10 in intensity.  Pt states she has a history of pancreatitis and feels that this is the same thing happening again.  Pt is ST on cardiac monitor HR 110-120, RR 18-24, O2 sats 96-97% on room air, BP 150/70.    Interventions: IV started by Nursing Supervisor and NS bolus started; 4mg  IV morphine given with orders from Dr. Fredia BeetsPuchilowska. Labs drawn for CMP and CBC. Attempted to position patient for comfort. Pt taken for CT abd/pelvis and transferred to room 225 for medical evaluation and care.  Plan of Care (if not transferred):  Event Summary: Name of Physician Notified: Dr. Hilton SinclairWeiting at [phone]    Dr. Lemar LivingsByrnett notified at 2015    Outcome: Transferred (Comment) (tx to 225)  Event End Time: 2045  Dutch QuintPatel, Baran Kuhrt D

## 2015-12-04 ENCOUNTER — Inpatient Hospital Stay: Payer: Medicare Other

## 2015-12-04 ENCOUNTER — Inpatient Hospital Stay (HOSPITAL_COMMUNITY)
Admission: EM | Admit: 2015-12-04 | Discharge: 2015-12-05 | Disposition: A | Discharge status: Other Acute Inpatient Hospital | Payer: Medicare Other | Source: Ambulatory Visit | Attending: Psychiatry | Admitting: Psychiatry

## 2015-12-04 DIAGNOSIS — F17200 Nicotine dependence, unspecified, uncomplicated: Secondary | ICD-10-CM | POA: Diagnosis present

## 2015-12-04 DIAGNOSIS — F1020 Alcohol dependence, uncomplicated: Secondary | ICD-10-CM | POA: Diagnosis present

## 2015-12-04 DIAGNOSIS — Z9049 Acquired absence of other specified parts of digestive tract: Secondary | ICD-10-CM

## 2015-12-04 DIAGNOSIS — G40909 Epilepsy, unspecified, not intractable, without status epilepticus: Secondary | ICD-10-CM

## 2015-12-04 DIAGNOSIS — Z9851 Tubal ligation status: Secondary | ICD-10-CM

## 2015-12-04 DIAGNOSIS — Z809 Family history of malignant neoplasm, unspecified: Secondary | ICD-10-CM

## 2015-12-04 DIAGNOSIS — F17210 Nicotine dependence, cigarettes, uncomplicated: Secondary | ICD-10-CM | POA: Diagnosis present

## 2015-12-04 DIAGNOSIS — I472 Ventricular tachycardia: Secondary | ICD-10-CM

## 2015-12-04 DIAGNOSIS — F1994 Other psychoactive substance use, unspecified with psychoactive substance-induced mood disorder: Secondary | ICD-10-CM | POA: Diagnosis present

## 2015-12-04 DIAGNOSIS — F3181 Bipolar II disorder: Principal | ICD-10-CM | POA: Diagnosis present

## 2015-12-04 DIAGNOSIS — Z818 Family history of other mental and behavioral disorders: Secondary | ICD-10-CM

## 2015-12-04 DIAGNOSIS — Z9889 Other specified postprocedural states: Secondary | ICD-10-CM

## 2015-12-04 DIAGNOSIS — Z9114 Patient's other noncompliance with medication regimen: Secondary | ICD-10-CM

## 2015-12-04 DIAGNOSIS — F1420 Cocaine dependence, uncomplicated: Secondary | ICD-10-CM | POA: Diagnosis present

## 2015-12-04 DIAGNOSIS — F1120 Opioid dependence, uncomplicated: Secondary | ICD-10-CM | POA: Diagnosis present

## 2015-12-04 DIAGNOSIS — Z9119 Patient's noncompliance with other medical treatment and regimen: Secondary | ICD-10-CM

## 2015-12-04 DIAGNOSIS — Z888 Allergy status to other drugs, medicaments and biological substances status: Secondary | ICD-10-CM

## 2015-12-04 DIAGNOSIS — Z79899 Other long term (current) drug therapy: Secondary | ICD-10-CM

## 2015-12-04 DIAGNOSIS — Z915 Personal history of self-harm: Secondary | ICD-10-CM

## 2015-12-04 DIAGNOSIS — N63 Unspecified lump in breast: Secondary | ICD-10-CM | POA: Diagnosis present

## 2015-12-04 DIAGNOSIS — Z8619 Personal history of other infectious and parasitic diseases: Secondary | ICD-10-CM

## 2015-12-04 DIAGNOSIS — R140 Abdominal distension (gaseous): Secondary | ICD-10-CM

## 2015-12-04 LAB — CBC
HEMATOCRIT: 38.1 % (ref 35.0–47.0)
Hemoglobin: 13 g/dL (ref 12.0–16.0)
MCH: 28.4 pg (ref 26.0–34.0)
MCHC: 34.2 g/dL (ref 32.0–36.0)
MCV: 82.9 fL (ref 80.0–100.0)
PLATELETS: 284 10*3/uL (ref 150–440)
RBC: 4.59 MIL/uL (ref 3.80–5.20)
RDW: 12.6 % (ref 11.5–14.5)
WBC: 12.6 10*3/uL — AB (ref 3.6–11.0)

## 2015-12-04 LAB — COMPREHENSIVE METABOLIC PANEL
ALBUMIN: 3.7 g/dL (ref 3.5–5.0)
ALK PHOS: 75 U/L (ref 38–126)
ALT: 27 U/L (ref 14–54)
ANION GAP: 6 (ref 5–15)
AST: 37 U/L (ref 15–41)
BUN: 7 mg/dL (ref 6–20)
CHLORIDE: 104 mmol/L (ref 101–111)
CO2: 26 mmol/L (ref 22–32)
Calcium: 8.7 mg/dL — ABNORMAL LOW (ref 8.9–10.3)
Creatinine, Ser: 0.77 mg/dL (ref 0.44–1.00)
GFR calc Af Amer: >60 mL/min (ref >60)
GFR calc non Af Amer: >60 mL/min (ref >60)
GLUCOSE: 101 mg/dL — AB (ref 65–99)
POTASSIUM: 4.1 mmol/L (ref 3.5–5.1)
SODIUM: 136 mmol/L (ref 135–145)
Total Bilirubin: 0.2 mg/dL — ABNORMAL LOW (ref 0.3–1.2)
Total Protein: 6.8 g/dL (ref 6.5–8.1)

## 2015-12-04 LAB — BRAIN NATRIURETIC PEPTIDE: B NATRIURETIC PEPTIDE 5: 69 pg/mL (ref 0.0–100.0)

## 2015-12-04 LAB — LIPASE, BLOOD: Lipase: 31 U/L (ref 11–51)

## 2015-12-04 LAB — AMYLASE: Amylase: 70 U/L (ref 28–100)

## 2015-12-04 MED ORDER — TRAZODONE HCL 100 MG PO TABS
100.0000 mg | ORAL_TABLET | Freq: Every evening | ORAL | Status: DC | PRN
  Administered 2015-12-04: 100 mg via ORAL
  Filled 2015-12-04: qty 1

## 2015-12-04 MED ORDER — ACETAMINOPHEN 325 MG PO TABS: 650.0000 mg | ORAL_TABLET | Freq: Four times a day (QID) | ORAL | Status: DC | PRN

## 2015-12-04 MED ORDER — TRAZODONE HCL 100 MG PO TABS: 100.0000 mg | ORAL_TABLET | Freq: Every evening | ORAL | 1 refills | Status: AC | PRN

## 2015-12-04 MED ORDER — PHENYTOIN SODIUM EXTENDED 100 MG PO CAPS
300.0000 mg | ORAL_CAPSULE | Freq: Every day | ORAL | Status: DC
  Administered 2015-12-04: 300 mg via ORAL
  Filled 2015-12-04: qty 3

## 2015-12-04 MED ORDER — PANTOPRAZOLE SODIUM 40 MG PO TBEC: 40.0000 mg | DELAYED_RELEASE_TABLET | Freq: Every day | ORAL | 1 refills | Status: DC

## 2015-12-04 MED ORDER — GABAPENTIN 600 MG PO TABS: 1800.0000 mg | ORAL_TABLET | Freq: Two times a day (BID) | ORAL | 1 refills | Status: DC

## 2015-12-04 MED ORDER — PANTOPRAZOLE SODIUM 40 MG PO TBEC
40.0000 mg | DELAYED_RELEASE_TABLET | Freq: Every day | ORAL | Status: DC
  Administered 2015-12-04 – 2015-12-05 (×2): 40 mg via ORAL
  Filled 2015-12-04 (×2): qty 1

## 2015-12-04 MED ORDER — QUETIAPINE FUMARATE 300 MG PO TABS: 300.0000 mg | ORAL_TABLET | Freq: Every day | ORAL | 1 refills | Status: DC

## 2015-12-04 MED ORDER — ALUM & MAG HYDROXIDE-SIMETH 200-200-20 MG/5ML PO SUSP: 30.0000 mL | ORAL | Status: DC | PRN

## 2015-12-04 MED ORDER — GABAPENTIN 300 MG PO CAPS
1800.0000 mg | ORAL_CAPSULE | Freq: Two times a day (BID) | ORAL | Status: DC
  Administered 2015-12-04 – 2015-12-05 (×3): 1800 mg via ORAL
  Filled 2015-12-04 (×3): qty 6

## 2015-12-04 MED ORDER — MAGNESIUM HYDROXIDE 400 MG/5ML PO SUSP: 30.0000 mL | Freq: Every day | ORAL | Status: DC | PRN

## 2015-12-04 MED ORDER — NICOTINE 21 MG/24HR TD PT24
21.0000 mg | MEDICATED_PATCH | Freq: Every day | TRANSDERMAL | Status: DC
  Administered 2015-12-04 – 2015-12-05 (×2): 21 mg via TRANSDERMAL
  Filled 2015-12-04 (×2): qty 1

## 2015-12-04 MED ORDER — QUETIAPINE FUMARATE 300 MG PO TABS: 300.0000 mg | ORAL_TABLET | Freq: Every day | ORAL | 1 refills | Status: AC

## 2015-12-04 MED ORDER — SIMETHICONE 80 MG PO CHEW
80.0000 mg | CHEWABLE_TABLET | Freq: Four times a day (QID) | ORAL | Status: DC | PRN
  Administered 2015-12-05: 80 mg via ORAL
  Filled 2015-12-04 (×2): qty 1

## 2015-12-04 MED ORDER — QUETIAPINE FUMARATE 200 MG PO TABS
300.0000 mg | ORAL_TABLET | Freq: Every day | ORAL | Status: DC
  Administered 2015-12-04: 300 mg via ORAL
  Filled 2015-12-04: qty 1

## 2015-12-04 MED ORDER — FUROSEMIDE 40 MG PO TABS
40.0000 mg | ORAL_TABLET | Freq: Once | ORAL | Status: AC
  Administered 2015-12-04: 40 mg via ORAL
  Filled 2015-12-04: qty 1

## 2015-12-04 MED ORDER — PHENYTOIN SODIUM EXTENDED 100 MG PO CAPS: 300.0000 mg | ORAL_CAPSULE | Freq: Every day | ORAL | 1 refills | Status: DC

## 2015-12-04 MED ORDER — TRAZODONE HCL 100 MG PO TABS: 100.0000 mg | ORAL_TABLET | Freq: Every evening | ORAL | 1 refills | Status: DC | PRN

## 2015-12-04 MED ORDER — FUROSEMIDE 40 MG PO TABS: 20.0000 mg | ORAL_TABLET | Freq: Every day | ORAL | Status: DC | PRN

## 2015-12-04 NOTE — [facility] Group Notes (Signed)
[facility] LCSW Group Therapy Note  12/04/2015 1:00pm  Type of Therapy and Topic: Group Therapy: Holding onto Grudges   Participation Level: Distracting, Inappropriate  Description of Group:  In this group patients will be asked to explore and define a grudge. Patients will be guided to discuss their thoughts, feelings, and behaviors as to why one holds on to grudges and reasons why people have grudges. Patients will process the impact grudges have on daily life and identify thoughts and feelings related to holding on to grudges. Facilitator will challenge patients to identify ways of letting go of grudges and the benefits once released. Patients will be confronted to address why one struggles letting go of grudges. Lastly, patients will identify feelings and thoughts related to what life would look like without grudges and actions steps that patients can take to begin to let go of the grudge. This group will be process-oriented, with patients participating in exploration of their own experiences as well as giving and receiving support and challenge from other group members.    Therapeutic Goals:  1. Patient will identify specific grudges related to their personal life.  2. Patient will identify feelings, thoughts, and beliefs around grudges.  3. Patient will identify how one releases grudges appropriately.  4. Patient will identify situations where they could have let go of the grudge, but instead chose to hold on.    Summary of Patient Progress: Pt could be monopolizing at times, and continued to talk about revenge being "sweet", denying any need to change her behavior and expressing that grudges are beneficial. Pt was observed to be laughing inappropriately during group discussion.   Therapeutic Modalities:  Cognitive Behavioral Therapy  Solution Focused Therapy  Motivational Interviewing  Brief Therapy    Chad CordialLauren Carter, LCSWA 12/04/2015 1:04 PM

## 2015-12-04 NOTE — Progress Notes (Signed)
[facility] without [facility]. Skin assessment completed, no abnormalities or contraband found. Pt noted to have several tattoos. Pt drowsy and gait slightly unsteady due to medications given prior to admission. Pt given food tray and water and oriented to unit. Denies pain. Denies SI/HI/AVH. Safety maintained.

## 2015-12-04 NOTE — [facility] Suicide Risk Assessment (Signed)
[facility] INPATIENT:  Family/Significant Other Suicide Prevention Education  Suicide Prevention Education:  Patient Refusal for Family/Significant Other Suicide Prevention Education: The patient Pricilla LarssonShenna M Hessling has refused to provide written consent for family/significant other to be provided Family/Significant Other Suicide Prevention Education during admission and/or prior to discharge.  Physician notified.  Elaina HoopsLauren M Carter 12/04/2015, 10:15 AM
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
## 2015-12-04 NOTE — Progress Notes (Signed)
Pt affect irritable. Intrusive. Up requesting pain medication/subutex after vital signs yet came and demanded Remote control for TV in dayroom.

## 2015-12-04 NOTE — Plan of Care (Signed)
Problem: Health Behavior/Discharge Planning: Goal: Ability to identify changes in lifestyle to reduce recurrence of condition will improve Outcome: Not Progressing Unable to verbalize any changes.  Continually asks for subutex

## 2015-12-04 NOTE — Progress Notes (Signed)
Patient denies SI/HI/AVH.  Verbalizes that she is in pain and that she needs some lasix because she is swelling.  Edema noted in both hands and both ankles.  Abdoment is swollen and distended.  Dr. Jennet MaduroPucilowska aware and ordered medical consult.  Dr. Emmit PomfretHugelmeyer down to see patient.  Patient up for chest x-ray. Lab down to draw blood.  Patient irritable, intrusive and demanding at tiimes.

## 2015-12-04 NOTE — Tx Team (Signed)
Initial Treatment Plan 12/04/2015 1:09 AM Pricilla LarssonShenna M Frieling EAV:[id number]    PATIENT STRESSORS: Substance abuse   PATIENT STRENGTHS: Capable of independent living Communication skills General fund of knowledge   PATIENT IDENTIFIED PROBLEMS: Substance abuse      "I need to take better care of myself"  "getting off drugs"             DISCHARGE CRITERIA:  Improved stabilization in mood, thinking, and/or behavior Motivation to continue treatment in a less acute level of care  PRELIMINARY DISCHARGE PLAN: Outpatient therapy  PATIENT/FAMILY INVOLVEMENT: This treatment plan has been presented to and reviewed with the patient, Pricilla LarssonShenna M Height, and/or family member, .  The patient and family have been given the opportunity to ask questions and make suggestions.  Ernesto RutherfordKristen Lynnell Fiumara, RN 12/04/2015, 1:09 AM

## 2015-12-04 NOTE — H&P (Signed)
Psychiatric Admission Assessment Adult  Patient Identification: ANJULIE DIPIERRO MRN:  [id number] Date of Evaluation:  12/04/2015 Chief Complaint:  Depression Principal Diagnosis: Bipolar 2 disorder, major depressive episode ([place]) Diagnosis:   Patient Active Problem List   Diagnosis Date Noted  . Bipolar 2 disorder, major depressive episode ([place]) [F31.81] 11/29/2015  . Opioid use disorder, moderate, dependence ([place]) [F11.20] 11/29/2015  . Alcohol use disorder, moderate, dependence ([place]) [F10.20] 11/28/2015  . Seizure disorder ([place]) [M35.361] 11/28/2015  . Cocaine use disorder, moderate, dependence ([place]) [F14.20] 11/28/2015  . Substance induced mood disorder ([place]) [F19.94] 11/28/2015  . Tobacco use disorder [F17.200] 11/28/2015   History of Present Illness:   Identifying data. Ms. Bungert is a 45 year old female, depression, mood instability, and substance abuse.  Chief complaint. "I need my Subutex."   History of present illness. Information was obtained from the patient and the chart. The patient was admitted to psychiatry on 9/42017 4 worsening of depression and suicidal thinking in the context of substance abuse and treatment noncompliance. She was started on Librium and Suboxone taper. On  12/02/2015 she started complaining of severe abdominal pain suggestive of pancreatitis and was transferred to medical floor. She was ruled out for any medical emergency and return back to psychiatry. We now. That she was very in order to obtain a dose of narcotic painkillers. The patient has long history of bipolar illness and substance use including alcohol, cocaine and heroine. He had been clean of cocaine for the past 3 years while in Suboxone clinic in [facility] and taking Seroquel for bipolar disorder. She was recently released from jail after 120 days there. She was not given any medications jail. Following release she moved in with her in 5 days ago relapsed on heroin. There are multiple needle  marks on her body. Without medication and the patient became increasingly depressed with poor sleep, decreased appetite, anhedonia, being of guilt and hopelessness worthlessness, poor energy and concentration, crying spells, social isolation. Following relapse she became even more depressed and ashamed of herself. She became suicidal with a plan to overdose. 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 She also reports heightened anxiety with PTSD type symptoms from past abuse. She admits to drinking a bottle of liquor lately, using heroine and cocaine. She was brought to the hospital by her neighbor witnessed her having seizures. The patient has history of seizure disorder and has been maintained on a combination of Dilantin and Neurontin. She does not believe that her seizures are related to alcohol withdrawal. She does not have a driver's license because of seizures.  Past psychiatric history. She was hospitalized before for substance abuse as well as depression. 6 years ago she attempted suicide by medication overdose. She had been tried on multiple medications in the past but believes that Seroquel works best for her. She reports maintaining sobriety while on Suboxone.  Family psychiatric history. Multiple family members with depression and anxiety. Brother with heroine addiction.  Social history. She is disabled from bipolar disorder. She will be staying at the [facility] following discharge. She is not interested in residential substance abuse treatment and would like to follow up with Suboxone clinic and AA. As she denies any current legal charges but does not have an ID.  Total Time spent with patient: 1 hour  Is the patient at risk to self? No.  Has the patient been a risk to self in the past 6 months? Yes.  Has the patient been a risk to self within the distant past? No.  Is the patient a risk to others? No.  Has the patient been  a risk to others in the past 6 months? No.  Has the patient been a risk to others within the distant past? No.   Prior Inpatient Therapy:   Prior Outpatient Therapy:    Alcohol Screening: 1. How often do you have a drink containing alcohol?: 4 or more times a week 2. How many drinks containing alcohol do you have on a typical day when you are drinking?: 7, 8, or 9 3. How often do you have six or more drinks on one occasion?: Daily or almost daily Preliminary Score: 7 4. How often during the last year have you found that you were not able to stop drinking once you had started?: Monthly 5. How often during the last year have you failed to do what was normally expected from you becasue of drinking?: Weekly 6. How often during the last year have you needed a first drink in the morning to get yourself going after a heavy drinking session?: Weekly 7. How often during the last year have you had a feeling of guilt of remorse after drinking?: Weekly 8. How often during the last year have you been unable to remember what happened the night before because you had been drinking?: Never 9. Have you or someone else been injured as a result of your drinking?: No 10. Has a relative or friend or a doctor or another health worker been concerned about your drinking or suggested you cut down?: Yes, during the last year Alcohol Use Disorder Identification Test Final Score (AUDIT): 26 Brief Intervention: Patient declined brief intervention Substance Abuse History in the last 12 months:  Yes.   Consequences of Substance Abuse: Negative Previous Psychotropic Medications: Yes  Psychological Evaluations: No  Past Medical History:  Past Medical History:  Diagnosis Date  . Hepatitis C   . Seizures ([place])     Past Surgical History:  Procedure Laterality Date  . ABDOMINAL SURGERY    . CHOLECYSTECTOMY    . OOPHORECTOMY    . OVARIAN CYST SURGERY    . TONSILLECTOMY    . TUBAL LIGATION     Family History:   Family History  Problem Relation Age of Onset  . Cancer Mother   . Healthy Father    Tobacco Screening:   Social History:  History  Alcohol Use  . Yes    Comment: Fifth of liquor daily      History  Drug Use  . Types: IV, Cocaine, Heroin    Comment: also Heroin    Additional Social History:                           Allergies:   Allergies  Allergen Reactions  . Ciprofloxacin Other (See Comments)    Unknown reaction  . Famotidine Other (See Comments)    Unknown reaction.  Lindajo Royal [Ziprasidone Hcl] Other (See Comments)    Unknown reaction  . Imitrex [Sumatriptan] Other (See Comments)    Unknown reaction  . Toradol [Ketorolac Tromethamine] Other (See Comments)    Unknown reaction  . Zofran [Ondansetron Hcl] Other (See Comments)    Unknown reaction   Lab Results:  Results for orders placed or performed during the hospital encounter of 12/02/15 (from the past 48 hour(s))  Hepatic function panel     Status: Abnormal  Collection Time: 12/02/15  8:16 PM  Result Value Ref Range   Total Protein 6.9 6.5 - 8.1 g/dL   Albumin 3.8 3.5 - 5.0 g/dL   AST 32 15 - 41 U/L   ALT 22 14 - 54 U/L   Alkaline Phosphatase 77 38 - 126 U/L   Total Bilirubin 0.1 (L) 0.3 - 1.2 mg/dL   Bilirubin, Direct <0.1 (L) 0.1 - 0.5 mg/dL   Indirect Bilirubin NOT CALCULATED 0.3 - 0.9 mg/dL  Basic metabolic panel     Status: Abnormal   Collection Time: 12/03/15  5:40 AM  Result Value Ref Range   Sodium 138 135 - 145 mmol/L   Potassium 4.2 3.5 - 5.1 mmol/L   Chloride 105 101 - 111 mmol/L   CO2 28 22 - 32 mmol/L   Glucose, Bld 94 65 - 99 mg/dL   BUN 10 6 - 20 mg/dL   Creatinine, Ser 0.54 0.44 - 1.00 mg/dL   Calcium 8.2 (L) 8.9 - 10.3 mg/dL   GFR calc non Af Amer >60 >60 mL/min   GFR calc Af Amer >60 >60 mL/min    Comment: (NOTE) The eGFR has been calculated using the CKD EPI equation. This calculation has not been validated in all clinical situations. eGFR's persistently <60 mL/min  signify possible Chronic Kidney Disease.    Anion gap 5 5 - 15  CBC     Status: None   Collection Time: 12/03/15  5:40 AM  Result Value Ref Range   WBC 9.3 3.6 - 11.0 K/uL   RBC 4.70 3.80 - 5.20 MIL/uL   Hemoglobin 13.6 12.0 - 16.0 g/dL   HCT 38.8 35.0 - 47.0 %   MCV 82.6 80.0 - 100.0 fL   MCH 28.9 26.0 - 34.0 pg   MCHC 35.0 32.0 - 36.0 g/dL   RDW 12.8 11.5 - 14.5 %   Platelets 239 150 - 440 K/uL    Blood Alcohol level:  Lab Results  Component Value Date   ETH 23 (H) 11/27/2015   ETH  07/30/2008    <5        LOWEST DETECTABLE LIMIT FOR SERUM ALCOHOL IS 5 mg/dL FOR MEDICAL PURPOSES ONLY    Metabolic Disorder Labs:  Lab Results  Component Value Date   HGBA1C 5.1 11/29/2015   No results found for: PROLACTIN Lab Results  Component Value Date   CHOL 120 11/29/2015   TRIG 141 11/29/2015   HDL 37 (L) 11/29/2015   CHOLHDL 3.2 11/29/2015   VLDL 28 11/29/2015   LDLCALC 55 11/29/2015    Current Medications: Current Facility-Administered Medications  Medication Dose Route Frequency Provider Last Rate Last Dose  . acetaminophen (TYLENOL) tablet 650 mg  650 mg Oral Q6H PRN Kasiah Manka B Amyla Heffner, MD      . alum & mag hydroxide-simeth (MAALOX/MYLANTA) 200-200-20 MG/5ML suspension 30 mL  30 mL Oral Q4H PRN Caylon Saine B Jacelynn Hayton, MD      . gabapentin (NEURONTIN) capsule 1,800 mg  1,800 mg Oral BID Clovis Fredrickson, MD   1,800 mg at 12/04/15 0951  . magnesium hydroxide (MILK OF MAGNESIA) suspension 30 mL  30 mL Oral Daily PRN Konor Noren B Annya Lizana, MD      . nicotine (NICODERM CQ - dosed in mg/24 hours) patch 21 mg  21 mg Transdermal Daily Persephanie Laatsch B Nashton Belson, MD   21 mg at 12/04/15 0949  . pantoprazole (PROTONIX) EC tablet 40 mg  40 mg Oral Daily Clovis Fredrickson, MD   40  mg at 12/04/15 0951  . phenytoin (DILANTIN) ER capsule 300 mg  300 mg Oral QHS Chrys Landgrebe B Artice Bergerson, MD      . QUEtiapine (SEROQUEL) tablet 300 mg  300 mg Oral QHS Akylah Hascall B Patrici Minnis, MD      .  traZODone (DESYREL) tablet 100 mg  100 mg Oral QHS PRN Clovis Fredrickson, MD       PTA Medications: Prescriptions Prior to Admission  Medication Sig Dispense Refill Last Dose  . gabapentin (NEURONTIN) 600 MG tablet Take 1,800 mg by mouth 2 (two) times daily.     . pantoprazole (PROTONIX) 40 MG tablet Take 1 tablet (40 mg total) by mouth daily. 30 tablet 0   . phenytoin (DILANTIN) 100 MG ER capsule Take 300 mg by mouth at bedtime.      . promethazine (PHENERGAN) 12.5 MG tablet Take 1-2 tablets (12.5-25 mg total) by mouth every 6 (six) hours as needed for nausea or vomiting. 30 tablet 0     Musculoskeletal: Strength & Muscle Tone: within normal limits Gait & Station: normal Patient leans: N/A  Psychiatric Specialty Exam: I reviewed physical exam performed on the medical floor and with her findings. Physical Exam  Nursing note and vitals reviewed.   Review of Systems  Psychiatric/Behavioral: Positive for substance abuse.    Blood pressure 105/60, pulse 92, temperature 98.3 F (36.8 C), resp. rate 18, height '5\' 9"'  (1.753 m), weight 72.6 kg (160 lb), last menstrual period 11/20/2015, SpO2 95 %.Body mass index is 23.63 kg/m.  See SRA.                                                      Treatment Plan Summary: Daily contact with patient to assess and evaluate symptoms and progress in treatment and Medication management   Ms. Baker is a 45 year old female with a history of depression, mood instability and substance abuse admitted for worsening of depression and suicidal ideation in the context of medication noncompliance and relapse on drugs.   1. Suicidal ideation. This has resolved. The patient is able to contract for safety. She is forward thinking and optimistic about the future.   2. Mood. She was restarted on Seroquel for depression and mood stabilization.   3. Alcohol dependence. She completed Librium taper.  4. Opiate dependence. She completed a  brief Suboxone treatment.   5. Seizure disorder. She was restarted on Dilantin and high dose Neurontin.  6. Smoking. Nicotine patch was available.  7. Substance abuse treatment. The patient declined residential treatment but would like to resume with Suboxone clinic and [place] meetings.   8. Metabolic syndrome monitoring. Lipid profile, TSH, and HgbA1C were normal.  9. EKG. Normal sinus rhythm. QT 390.  10. Abdominal pain. The patient develop sudden onset of abdominal pain suggestive of pancreatitis. She was transferred to medical floor and was ruled out for any medical emergency.   11. Disposition. She will  Be discharged to the homeless shelter tomorrow. She will follow up with [facility].     Observation Level/Precautions:  15 minute checks  Laboratory:  CBC Chemistry Profile UDS UA  Psychotherapy:    Medications:    Consultations:    Discharge Concerns:    Estimated LOS:  Other:     Physician Treatment Plan for Primary Diagnosis: Bipolar 2 disorder, major depressive  episode ([place]) Long Term Goal(s): Improvement in symptoms so as ready for discharge  Short Term Goals: Ability to identify changes in lifestyle to reduce recurrence of condition will improve, Ability to identify and develop effective coping behaviors will improve, Ability to maintain clinical measurements within normal limits will improve and Compliance with prescribed medications will improve  Physician Treatment Plan for Secondary Diagnosis: Principal Problem:   Bipolar 2 disorder, major depressive episode ([place]) Active Problems:   Alcohol use disorder, moderate, dependence ([facility])   Seizure disorder ([facility])   Cocaine use disorder, moderate, dependence ([facility])   Substance induced mood disorder ([place])   Tobacco use disorder   Opioid use disorder, moderate, dependence ([place])  Long Term Goal(s): Improvement in symptoms so as ready for discharge  Short Term Goals: Ability to identify changes in  lifestyle to reduce recurrence of condition will improve, Ability to demonstrate self-control will improve and Ability to identify triggers associated with substance abuse/mental health issues will improve  I certify that inpatient services furnished can reasonably be expected to improve the patient's condition.    Orson Slick, MD 9/10/201711:57 AM

## 2015-12-04 NOTE — Consult Note (Signed)
[facility] [place] [facility] Ad[facility] History and Physical [employer] Emer Onnen, D.O.  ---------------------------------------------------------------------------------------------------------------------   PATIENT NAMAnnitta Shaffer: Megan Shaffer MR#: [id number] DATE OF BIRTH: 07/30/1970 DATE OF ADMISSION: 12/04/2015 PRIMARY CARE PHYSICIAN: No PCP Per Patient  REQUESTING/REFERRING PHYSICIAN: Dr. Hinton DyerPucilowski  CHIEF COMPLAINT: No chief complaint on file.   HISTORY OF PRESENT ILLNESS: Megan Shaffer is a 45 y.o. female with a known history of seizure disorder, hepatitis C, pancreatitis bipolar disorder, polysubstance abuse including opiates, cocaine, alcohol and tobacco who was admitted by Dr. Jennet MaduroPucilowska on 11/29/2015 with a diagnosis of bipolar 2 disorder and substance abuse.Marland Kitchen.  She was seen by medicine as a rapid response and transferred to the medical floor secondary to complaint of severe abdominal pain on 12/02/2015. CT scan of the chest abdomen and pelvis was done, and showed no acute intrathoracic abdominal or pelvic pathology. Lab studies were all within normal limits She was also seen in consultation by surgery complaint of severe midepigastric pain and was treated with pain control and antiemetics for presumed pancreatitis. medical course was only complicated by a 5 beat run of V. tach during which  she was asymptomatic. Symptoms improved and patient was transferred back to inpatient psychiatry.  Medical consult was recall today for patient complaining of symptoms of swollen distended abdomen, bilateral hand swelling, shortness of breath and a tender lump on her left lateral breast. Patient states that she has had swelling in the abdomen and upper extremities which have occurred over the last 6-8 hours and are extremely painful. She states that this never happened to her before that she ordinarily has a flat stomach. Patient's nurse corroborates that her stomach was less distended this morning. Patient  states that she has had normal bowel movements with no constipation or diarrhea. She reports that the distention in her abdomen is causing upward pressure in her mid epigastrium and causing severe abdominal pain localized and radiating through to the back. She is requesting Lasix and pain medication.  She also complains of a swollen tender lump on the left lateral breast. She states it's been there for some time but is getting worse. He states that she has not had any procedures and her mammogram is up-to-date.  Otherwise there has been no change in status. Patient has been taking medication as prescribed and there has been no recent change in medication or diet.  There has been no recent illness, travel or sick contacts.    Patient denies fevers/chills, weakness, dizziness, chest pain, N/V/C/D,  dysuria/frequency, changes in mental status.   PAST MEDICAL HISTORY: Past Medical History:  Diagnosis Date  . Hepatitis C   . Seizures (HCC)       PAST SURGICAL HISTORY: Past Surgical History:  Procedure Laterality Date  . ABDOMINAL SURGERY    . CHOLECYSTECTOMY    . OOPHORECTOMY    . OVARIAN CYST SURGERY    . TONSILLECTOMY    . TUBAL LIGATION        SOCIAL HISTORY: Social History  Substance Use Topics  . Smoking status: Current Every Day Smoker    Packs/day: 1.50    Types: Cigarettes  . Smokeless tobacco: Never Used  . Alcohol use Yes     Comment: Fifth of liquor daily       FAMILY HISTORY: Family History  Problem Relation Age of Onset  . Cancer Mother   . Healthy Father      MEDICATIONS AT HOME: Prior to Admission medications   Medication Sig Start Date End Date  Taking? Authorizing Provider  gabapentin (NEURONTIN) 600 MG tablet Take 3 tablets (1,800 mg total) by mouth 2 (two) times daily. 12/04/15   Shari Prows, MD  pantoprazole (PROTONIX) 40 MG tablet Take 1 tablet (40 mg total) by mouth daily. 12/04/15   Shari Prows, MD  phenytoin (DILANTIN) 100 MG ER  capsule Take 3 capsules (300 mg total) by mouth at bedtime. 12/04/15   Shari Prows, MD  promethazine (PHENERGAN) 12.5 MG tablet Take 1-2 tablets (12.5-25 mg total) by mouth every 6 (six) hours as needed for nausea or vomiting. 12/03/15   Oralia Manis, MD  QUEtiapine (SEROQUEL) 300 MG tablet Take 1 tablet (300 mg total) by mouth at bedtime. 12/04/15   Shari Prows, MD  traZODone (DESYREL) 100 MG tablet Take 1 tablet (100 mg total) by mouth at bedtime as needed for sleep. 12/04/15   Shari Prows, MD      DRUG ALLERGIES: Allergies  Allergen Reactions  . Ciprofloxacin Other (See Comments)    Unknown reaction  . Famotidine Other (See Comments)    Unknown reaction.  Earnestine Leys [Ziprasidone Hcl] Other (See Comments)    Unknown reaction  . Imitrex [Sumatriptan] Other (See Comments)    Unknown reaction  . Toradol [Ketorolac Tromethamine] Other (See Comments)    Unknown reaction  . Zofran [Ondansetron Hcl] Other (See Comments)    Unknown reaction     REVIEW OF SYSTEMS: CONSTITUTIONAL: No fever/chills, fatigue, weakness, weight gain/loss, headache EYES: No blurry or double vision. ENT: No tinnitus, postnasal drip, redness or soreness of the oropharynx. RESPIRATORY: No cough, wheeze, hemoptysis, dyspnea. CARDIOVASCULAR: No chest pain, orthopnea, palpitations, syncope. GASTROINTESTINAL:Positive mid epigastric pain.  No nausea, vomiting, constipation, diarrhea, hematemesis, melena or hematochezia. GENITOURINARY: No dysuria or hematuria. ENDOCRINE: No polyuria or nocturia. No heat or cold intolerance. HEMATOLOGY: No anemia, bruising, bleeding. INTEGUMENTARY: No rashes, ulcers, lesions. MUSCULOSKELETAL: No arthritis, swelling, gout. Positive bilateral hand swelling  NEUROLOGIC: No numbness, tingling, weakness or ataxia. No seizure-type activity. PSYCHIATRIC: No anxiety, depression, insomnia. BREAST: Positive tender lump in the left lateral breast.  PHYSICAL  EXAMINATION: VITAL SIGNS: Blood pressure 105/60, pulse 92, temperature 98.3 F (36.8 C), resp. rate 18, height 5\' 9"  (1.753 m), weight 72.6 kg (160 lb), last menstrual period 11/20/2015, SpO2 95 %.  GENERAL: 45 y.o.-year-oldwhite female  patient, well-developed, well-nourished lying in the bed in no acute distress. mildly anxious.  Pleasant and cooperative.   HEENT: Head atraumatic, normocephalic. Pupils equal, round, reactive to light and accommodation. No scleral icterus. Extraocular muscles intact. Nares are patent. Oropharynx is clear. Mucus membranes moist. NECK: Supple, full range of motion. No JVD, no bruit heard. No thyroid enlargement, no tenderness, no cervical lymphadenopathy. CHEST: Normal breath sounds bilaterally. No wheezing, rales, rhonchi or crackles. No use of accessory muscles of respiration.  No reproducible chest wall tenderness.  BREAST: There is a small tender mass deep in the left lateral breast tissue. There is no redness or vascular streaking. The left breast is slightly larger than the right. The right breast exam is within normal limits. CARDIOVASCULAR: S1, S2 normal. No murmurs, rubs, or gallops. Cap refill <2 seconds. ABDOMEN: Soft, mildly distended with tenderness localized to the mid epigastrium. No rebound, guarding, rigidity. Normoactive bowel sounds present in all four quadrants. No organomegaly or mass. EXTREMITIES: Full range of motion.  Mild puffy bilateral hand swelling. NEUROLOGIC: Cranial nerves II through XII are grossly intact with no focal sensorimotor deficit. Muscle strength 5/5 in all extremities. Sensation intact.  Gait not checked. PSYCHIATRIC: The patient is alert and oriented x 3. Normal affect, mood, thought content. speech is somewhat pressured.  SKIN: Warm, dry, and intact without obvious rash, lesion, or ulcer.  LABORATORY PANEL:  CBC  Recent Labs Lab 12/04/15 1749  WBC 12.6*  HGB 13.0  HCT 38.1  PLT 284    ----------------------------------------------------------------------------------------------------------------- Chemistries  Recent Labs Lab 12/04/15 1749  NA 136  K 4.1  CL 104  CO2 26  GLUCOSE 101*  BUN 7  CREATININE 0.77  CALCIUM 8.7*  AST 37  ALT 27  ALKPHOS 75  BILITOT 0.2*   ------------------------------------------------------------------------------------------------------------------ Cardiac Enzymes No results for input(s): TROPONINI in the last 168 hours. ------------------------------------------------------------------------------------------------------------------  RADIOLOGY: Ct Abdomen Pelvis Wo Contrast  Result Date: 12/02/2015 CLINICAL DATA:  45 year old female with generalized chest pain and abdominal pain. History of hepatitis-C. EXAM: CT CHEST, ABDOMEN AND PELVIS WITHOUT CONTRAST TECHNIQUE: Multidetector CT imaging of the chest, abdomen and pelvis was performed following the standard protocol without IV contrast. COMPARISON:  CT dated 04/14/11 FINDINGS: Evaluation of this exam is limited in the absence of intravenous contrast. Evaluation is also somewhat limited due to respiratory motion artifact. CT CHEST FINDINGS There is a 4 mm right lower lobe nodule (Series 2 image 48), stable since study dated 2013. A 9 mm nodular density along the left fissure (series 2, image 31) also appears similar to prior study and may represent scarring. The lungs are clear. There is no pleural effusion or pneumothorax. The central airways are patent. There is no aneurysmal dilatation of the thoracic aorta. The central pulmonary arteries are grossly unremarkable on this noncontrast study. There is no cardiomegaly or pericardial effusion. No hilar or mediastinal adenopathy noted. The esophagus is grossly unremarkable. No thyroid nodules identified. There is no axillary adenopathy. The chest wall soft tissues appear unremarkable. The osseous structures are intact. CT ABDOMEN PELVIS  FINDINGS No intra-abdominal free air or free fluid. Cholecystectomy. The liver, pancreas, spleen, adrenal glands, kidneys, visualized ureters, and urinary bladder appear unremarkable. The uterus is anteverted and grossly unremarkable. The ovaries appear grossly unremarkable as well. Constipation. There is no evidence of bowel obstruction or active inflammation. Normal appendix. The abdominal aorta and IVC appear grossly unremarkable on this noncontrast study. No portal venous gas identified. There is no adenopathy. The abdominal wall soft tissues appear unremarkable. The osseous structures are intact. IMPRESSION: No acute intrathoracic, abdominal, or pelvic pathology. Electronically Signed   By: Elgie Collard M.D.   On: 12/02/2015 21:26   Dg Chest 1 View  Result Date: 12/04/2015 CLINICAL DATA:  Large lump on left breast. EXAM: CHEST 1 VIEW COMPARISON:  Chest x-ray October 21, 2012 FINDINGS: No pneumothorax. The heart, hila, and mediastinum are normal. No pulmonary nodules or masses. Mild opacity adjacent to the left heart border. No abnormality other than atelectasis seen in this region on yesterday's CT scan. IMPRESSION: Mild opacity in the lingula. Atelectasis was seen in this region on the CT scan from yesterday. No other abnormalities. Electronically Signed   By: Gerome Sam III M.D   On: 12/04/2015 18:00   Ct Chest Wo Contrast  Result Date: 12/02/2015 CLINICAL DATA:  45 year old female with generalized chest pain and abdominal pain. History of hepatitis-C. EXAM: CT CHEST, ABDOMEN AND PELVIS WITHOUT CONTRAST TECHNIQUE: Multidetector CT imaging of the chest, abdomen and pelvis was performed following the standard protocol without IV contrast. COMPARISON:  CT dated 04/14/11 FINDINGS: Evaluation of this exam is limited in the absence of intravenous contrast. Evaluation  is also somewhat limited due to respiratory motion artifact. CT CHEST FINDINGS There is a 4 mm right lower lobe nodule (Series 2 image  48), stable since study dated 2013. A 9 mm nodular density along the left fissure (series 2, image 31) also appears similar to prior study and may represent scarring. The lungs are clear. There is no pleural effusion or pneumothorax. The central airways are patent. There is no aneurysmal dilatation of the thoracic aorta. The central pulmonary arteries are grossly unremarkable on this noncontrast study. There is no cardiomegaly or pericardial effusion. No hilar or mediastinal adenopathy noted. The esophagus is grossly unremarkable. No thyroid nodules identified. There is no axillary adenopathy. The chest wall soft tissues appear unremarkable. The osseous structures are intact. CT ABDOMEN PELVIS FINDINGS No intra-abdominal free air or free fluid. Cholecystectomy. The liver, pancreas, spleen, adrenal glands, kidneys, visualized ureters, and urinary bladder appear unremarkable. The uterus is anteverted and grossly unremarkable. The ovaries appear grossly unremarkable as well. Constipation. There is no evidence of bowel obstruction or active inflammation. Normal appendix. The abdominal aorta and IVC appear grossly unremarkable on this noncontrast study. No portal venous gas identified. There is no adenopathy. The abdominal wall soft tissues appear unremarkable. The osseous structures are intact. IMPRESSION: No acute intrathoracic, abdominal, or pelvic pathology. Electronically Signed   By: Elgie Collard M.D.   On: 12/02/2015 21:26    EKG:  Pending   IMPRESSION AND PLAN:  This is a 45 y.o. female with a hiSeizures, hepatitis C, pancreatitis bipolar disorder, polysubstance abuse now with:  1. Recurrent severe midepigastric abdominal pain. Recent medical evaluation has been unremarkable. I will repeat labs including CBC, CMP to check LFTs, amylase and lipase as well as order a complete abdominal ultrasound to evaluate further for pancreatitis and other biliary etiologies. Pain control and antiemetics as needed.   Clear liquid diet for now.  We will check repeat EKG given recent history of 5 beat run of Vtach and check mag.   2. Bilateral hand swelling-we'll administer Lasix for symptomatic relief and observe. 3. Left lateral breast mass-will consult general surgery for further recommendations regarding workup.  4. History of hep C - LFTs wnl will recheck 5. History of seizures - not currently taking any medication 6. History of polysubstance abuse, including heroin, cocaine. Alcohol and tobacco - management per psych 7. History of bipolar disorder, currently admitted for psychiatric management.   Management plans discussed with the patient who express understanding and agree with plan of care.   TOTAL TIME TAKING CARE OF THIS PATIENT: 60 minutes.   Carmen Tolliver D.O. on 12/04/2015 at 7:46 PM Between 7am to 6pm - Pager - [phone] After 6pm go to [email or web address] - Biomedical engineer [facility] Office [phone] CC: Primary care physician; No PCP Per Patient     Note: This dictation was prepared with Dragon dictation along with smaller phrase technology. Any transcriptional errors that result from this process are unintentional.

## 2015-12-04 NOTE — Progress Notes (Signed)
Patient slept 3 hours.

## 2015-12-04 NOTE — [facility] Suicide Risk Assessment (Signed)
[facility]   Nursing information obtained from:    Demographic factors:    Current Mental Status:    Loss Factors:    Historical Factors:    Risk Reduction Factors:     Total Time spent with patient: 1 hour Principal Problem: Bipolar 2 disorder, major depressive episode (HCC) Diagnosis:   Patient Active Problem List   Diagnosis Date Noted  . Bipolar 2 disorder, major depressive episode (HCC) [F31.81] 11/29/2015  . Opioid use disorder, moderate, dependence (HCC) [F11.20] 11/29/2015  . Alcohol use disorder, moderate, dependence (HCC) [F10.20] 11/28/2015  . Seizure disorder (HCC) [G40.909] 11/28/2015  . Cocaine use disorder, moderate, dependence (HCC) [F14.20] 11/28/2015  . Substance induced mood disorder (HCC) [F19.94] 11/28/2015  . Tobacco use disorder [F17.200] 11/28/2015   Subjective Data: Depression, substance abuse.  Continued Clinical Symptoms:  Alcohol Use Disorder Identification Test Final Score (AUDIT): 26 The "Alcohol Use Disorders Identification Test", Guidelines for Use in Primary Care, Second Edition.  World Science writerHealth Organization [facility](WHO). Score between 0-7:  no or low risk or alcohol related problems. Score between 8-15:  moderate risk of alcohol related problems. Score between 16-19:  high risk of alcohol related problems. Score 20 or above:  warrants further diagnostic evaluation for alcohol dependence and treatment.   CLINICAL FACTORS:   Bipolar Disorder:   Depressive phase Alcohol/Substance Abuse/Dependencies   Musculoskeletal: Strength & Muscle Tone: within normal limits Gait & Station: normal Patient leans: N/A  Psychiatric Specialty Exam: Physical Exam  Nursing note and vitals reviewed.   Review of Systems  Gastrointestinal: Positive for nausea.  Psychiatric/Behavioral: Positive for substance abuse. The patient is nervous/anxious.   All other systems reviewed and are negative.   Blood pressure 105/60, pulse 92, temperature  98.3 F (36.8 C), resp. rate 18, height 5\' 9"  (1.753 m), weight 72.6 kg (160 lb), last menstrual period 11/20/2015, SpO2 95 %.Body mass index is 23.63 kg/m.  General Appearance: Casual  Eye Contact:  Fair  Speech:  Clear and Coherent  Volume:  Normal  Mood:  Dysphoric and Irritable  Affect:  Appropriate  Thought Process:  Goal Directed  Orientation:  Full (Time, Place, and Person)  Thought Content:  WDL  Suicidal Thoughts:  No  Homicidal Thoughts:  No  Memory:  Immediate;   Fair Recent;   Fair Remote;   Fair  Judgement:  Fair  Insight:  Fair  Psychomotor Activity:  Normal  Concentration:  Concentration: Fair and Attention Span: Fair  Recall:  [employer]  Fund of Knowledge:  Fair  Language:  Fair  Akathisia:  No  Handed:  Right  AIMS (if indicated):     Assets:  Communication Skills Desire for Improvement Financial Resources/Insurance Physical Health Resilience  ADL's:  Intact  Cognition:  WNL  Sleep:         COGNITIVE FEATURES THAT CONTRIBUTE TO RISK:  None    SUICIDE RISK:   Minimal: No identifiable suicidal ideation.  Patients presenting with no risk factors but with morbid ruminations; may be classified as minimal risk based on the severity of the depressive symptoms   PLAN OF CARE: Hospital admission, medication management, absence abuse counseling, discharge planning.  Ms. Loleta ChanceHill is a 45 year old female with a history of depression, mood instability, and substance use was transferred back from medical floor where she was briefly hospitalized with a suspicion of pancreatitis.   Ms. Loleta ChanceHill is a 45 year old female with a history of depression, mood instability and substance abuse admitted for worsening of depression  and suicidal ideation in the context of medication noncompliance and relapse on drugs.   1. Suicidal ideation. This has resolved. The patient is able to contract for safety. She is forward thinking and optimistic about the future.   2. Mood. She was  restarted on Seroquel for depression and mood stabilization.   3. Alcohol dependence. She completed Librium taper.  4. Opiate dependence. She completed a brief Suboxone treatment.   5. Seizure disorder. She was restarted on Dilantin and high dose Neurontin.  6. Smoking. Nicotine patch was available.  7. Substance abuse treatment. The patient declined residential treatment but would like to resume with Suboxone clinic and AA meetings.   8. Metabolic syndrome monitoring. Lipid profile, TSH, and HgbA1C were normal.  9. EKG. Normal sinus rhythm. QT 390.  10. Abdominal pain. The patient develop sudden onset of abdominal pain suggestive of pancreatitis. She was transferred to medical floor and was ruled out for any medical emergency.   11. Disposition. She will  Be discharged to the homeless shelter tomorrow. She will follow up with [facility].     I certify that inpatient services furnished can reasonably be expected to improve the patient's condition.  Kristine Linea, MD 12/04/2015, 11:51 AM

## 2015-12-04 NOTE — Progress Notes (Signed)
Patient discharged. IV's removed, telemetry removed. [facility] Pt given copy of discharge papers and her belongings with her.

## 2015-12-05 ENCOUNTER — Inpatient Hospital Stay: Payer: Medicare Other

## 2015-12-05 MED ORDER — FUROSEMIDE 40 MG PO TABS: 20.0000 mg | ORAL_TABLET | Freq: Once | ORAL | Status: DC

## 2015-12-05 MED ORDER — FUROSEMIDE 20 MG PO TABS: 20.0000 mg | ORAL_TABLET | Freq: Every day | ORAL | 1 refills | Status: DC | PRN

## 2015-12-05 NOTE — Progress Notes (Signed)
Discharge Note:  Patient discharged from unit at 1255 pm.  Patient denies SI/AVH/HI.  Patient appeared to be in no acute distress at this time.  Patient discharge instructions and follow-up appointments were explained and patient verbalized understanding.  Patient belongings returned and patient acknowledged receipt of those items.  Patient left via taxi.

## 2015-12-05 NOTE — Discharge Summary (Signed)
Physician Discharge Summary Note  Patient:  Pricilla LarssonShenna M Strickland is an 45 y.o., female MRN:  [id number] DOB:  10/07/1970 Patient phone:  [phone] (home)  Patient address:   [address] [place] [id number],  Total Time spent with patient: 30 minutes  Date of Admission:  12/04/2015 Date of Discharge: 12/05/2015  Reason for Admission:  Suicidal ideation.  Identifying data. Ms. Loleta ChanceHill is a 45 year old female,depression, mood instability, and substance abuse.  Chief complaint. "I need my Subutex."   History of present illness. Information was obtained from the patient and the chart. The patient was admitted to psychiatry on 9/42017 4 worsening of depression and suicidal thinking in the context of substance abuse and treatment noncompliance. She was started on Librium and Suboxone taper. On  12/02/2015 she started complaining of severe abdominal pain suggestive of pancreatitis and was transferred to medical floor. She was ruled out for any medical emergency and return back to psychiatry. We now. That she was very in order to obtain a dose of narcotic painkillers. The patient has long history of bipolar illness and substance use including alcohol, cocaine and heroine. He hadbeen clean of cocaine for the past 3 years while in [facility] and taking Seroquel for bipolar disorder. She was recently released from jail after 120 days there. She was not given any medications jail. Following release she moved in with her in 5 days ago relapsed on heroin. There are multiple needle marks on her body. Without medication and the patient became increasingly depressed with poor sleep, decreased appetite, anhedonia, being of guilt and hopelessness worthlessness, poor energy and concentration, crying spells, social isolation. Following relapse she became even more depressed and ashamed of herself. She became suicidal with a plan to overdose. She denies psychotic symptoms but the diagnosis of alcohol  hallucinosis was given on admission. She also reports heightened anxiety with PTSD typesymptoms from past abuse. She admits to drinking a bottle of liquor lately, using heroine and cocaine. She was brought to the hospital by her neighbor witnessed her having seizures. The patient has history of seizure disorder and has been maintained on a combination of Dilantin and Neurontin. She does not believe that her seizures are related to alcohol withdrawal. She does not have a driver's license because of seizures.  Past psychiatric history. She was hospitalized before for substance abuse as well as depression. 6 years ago she attempted suicide by medication overdose. She had been tried on multiple medications in the past but believes that Seroquel works best for her. She reports maintaining sobriety while on Suboxone.  Family psychiatric history. Multiple family members with depression and anxiety. Brother with heroine addiction.  Social history. She is disabled from bipolar disorder. [facility]. She is not interested in residential substance abuse treatment and would like to follow up with Suboxone clinic and AA. As she denies any current legal charges but does not have an ID.  Principal Problem: Bipolar 2 disorder, major depressive episode [facility](HCC) Discharge Diagnoses: Patient Active Problem List   Diagnosis Date Noted  . Bipolar 2 disorder, major depressive episode (HCC) [F31.81] 11/29/2015  . Opioid use disorder, moderate, dependence (HCC) [F11.20] 11/29/2015  . Alcohol use disorder, moderate, dependence (HCC) [F10.20] 11/28/2015  . Seizure disorder (HCC) [G40.909] 11/28/2015  . Cocaine use disorder, moderate, dependence (HCC) [F14.20] 11/28/2015  . Substance induced mood disorder (HCC) [F19.94] 11/28/2015  . Tobacco use disorder [F17.200] 11/28/2015  Past Medical History:  Past Medical History:  Diagnosis Date  . Hepatitis C   . Seizures  (HCC)     Past Surgical History:  Procedure Laterality Date  . ABDOMINAL SURGERY    . CHOLECYSTECTOMY    . OOPHORECTOMY    . OVARIAN CYST SURGERY    . TONSILLECTOMY    . TUBAL LIGATION     Family History:  Family History  Problem Relation Age of Onset  . Cancer Mother   . Healthy Father    Social History:  History  Alcohol Use  . Yes    Comment: Fifth of liquor daily      History  Drug Use  . Types: IV, Cocaine, Heroin    Comment: also Heroin    Social History   Social History  . Marital status: Divorced    Spouse name: N/A  . Number of children: N/A  . Years of education: N/A   Social History Main Topics  . Smoking status: Current Every Day Smoker    Packs/day: 1.50    Types: Cigarettes  . Smokeless tobacco: Never Used  . Alcohol use Yes     Comment: Fifth of liquor daily   . Drug use:     Types: IV, Cocaine, Heroin     Comment: also Heroin  . Sexual activity: Not Asked   Other Topics Concern  . None   Social History Narrative  . None    Hospital Course:    Ms. Ivey is a 45 year old female with a history of depression, mood instability and substance abuse admitted for worsening of depression and suicidal ideation in the context of medication noncompliance and relapse on drugs.   1. Suicidal ideation. This has resolved. The patient is able to contract for safety. She is forward thinking and optimistic about the future.   2. Mood. She was restarted on Seroquel for depression and mood stabilization.   3. Alcohol dependence. She completed Librium taper.  4. Opiate dependence. She completed a brief Suboxone treatment.   5. Seizure disorder. She was restarted on Dilantin and high dose Neurontin.  6. Smoking. Nicotine patch was available.  7. Substance abuse treatment. The patient declined residential treatment but would like to resume with Suboxone clinic and AA meetings.   8. Metabolic syndrome monitoring. Lipid profile, TSH, and HgbA1C  were normal.  9. EKG. Normal sinus rhythm. QT 390.  10. Abdominal pain. The patient develop sudden onset of abdominal pain suggestive of pancreatitis for which she was transferred to medical floor and was ruled out for any medical emergency. Upon return, she complained of extremities swelling and abdominal distension. Medicine consult was obtained againe. Furosemide ordered, Abdominal US negative.   11. Disposition. She was discharged to the homeless shelter. She will follow up with [facility].    Physical Findings: AIMS:  , ,  ,  ,    CIWA:    COWS:     Musculoskeletal: Strength & Muscle Tone: within normal limits Gait & Station: normal Patient leans: N/A  Psychiatric Specialty Exam: Physical Exam  Nursing note and vitals reviewed.   Review of Systems  Psychiatric/Behavioral: Positive for substance abuse.  All other systems reviewed and are negative.   Blood pressure 106/64, pulse 88, temperature 98.7 F (37.1 C), temperature source Oral, resp. rate 18, height 5\' 9"  (1.753 m), weight 72.6 kg (160 lb), last menstrual period 11/20/2015, SpO2 95 %.Body mass index is 23.63 kg/m.  See SRA.  Has this patient used any form of tobacco in the last 30 days? (Cigarettes, Smokeless Tobacco, Cigars, and/or Pipes) Yes, Yes, A prescription for an FDA-approved tobacco cessation medication was offered at discharge and the patient refused  Blood Alcohol level:  Lab Results  Component Value Date   ETH 23 (H) 11/27/2015   ETH  07/30/2008    <5        LOWEST DETECTABLE LIMIT FOR SERUM ALCOHOL IS 5 mg/dL FOR MEDICAL PURPOSES ONLY    Metabolic Disorder Labs:  Lab Results  Component Value Date   HGBA1C 5.1 11/29/2015   No results found for: PROLACTIN Lab Results  Component Value Date   CHOL 120 11/29/2015   TRIG 141 11/29/2015   HDL 37 (L) 11/29/2015   CHOLHDL 3.2 11/29/2015   VLDL  28 11/29/2015   LDLCALC 55 11/29/2015    See Psychiatric Specialty Exam and Suicide Risk Assessment completed by Attending Physician prior to discharge.  Discharge destination:  Other:  homeless shelter.  Is patient on multiple antipsychotic therapies at discharge:  No   Has Patient had three or more failed trials of antipsychotic monotherapy by history:  No  Recommended Plan for Multiple Antipsychotic Therapies: NA  Discharge Instructions    Diet - low sodium heart healthy    Complete by:  As directed   Increase activity slowly    Complete by:  As directed       Medication List    STOP taking these medications   promethazine 12.5 MG tablet Commonly known as:  PHENERGAN     TAKE these medications     Indication  furosemide 20 MG tablet Commonly known as:  LASIX Take 1 tablet (20 mg total) by mouth daily as needed for fluid or edema.  Indication:  Edema   gabapentin 600 MG tablet Commonly known as:  NEURONTIN Take 3 tablets (1,800 mg total) by mouth 2 (two) times daily.  Indication:  Simple Seizure   pantoprazole 40 MG tablet Commonly known as:  PROTONIX Take 1 tablet (40 mg total) by mouth daily.  Indication:  Excess Stomach Secretions   phenytoin 100 MG ER capsule Commonly known as:  DILANTIN Take 3 capsules (300 mg total) by mouth at bedtime.  Indication:  Tonic-Clonic Seizures   QUEtiapine 300 MG tablet Commonly known as:  SEROQUEL Take 1 tablet (300 mg total) by mouth at bedtime.  Indication:  Depressive Phase of Manic-Depression   traZODone 100 MG tablet Commonly known as:  DESYREL Take 1 tablet (100 mg total) by mouth at bedtime as needed for sleep.  Indication:  Trouble Sleeping      Follow-up Information    [facility] .   Why:  Please present between walk-in hours Monday-Friday 9am-4pm. Bring a photo ID and discharge paperwork to the initial appointment at which you will be established for medication management and therapy  services. Contact information: [address] [place], [place] [address] [phone]          Follow-up recommendations:  Activity:  As tolerated. Diet:  Low sodium heart healthy. Other:  Keep follow-up appointments.  Comments:    Signed: Kristine Linea, MD 12/05/2015, 8:57 AM

## 2015-12-05 NOTE — Progress Notes (Signed)
Pt denies any SI/AVH. Stated that she needed to "get out of here because the doctor discontinued her subutex." Pt complained of pain. Was visible in dayroom watching t.v. With peers at times. Restless and paced the unit at times. Compliant with evening medications. Given PRN Trazodone for sleep. Pt had Complete US of abdomen this a.m. At 0430. Currently resting in bed with eyes closed.

## 2015-12-05 NOTE — Plan of Care (Signed)
Problem: [facility] Participation in Recreation Therapeutic Interventions Goal: STG-Patient will identify at least five coping skills for ** STG: Coping Skills - Within 3 treatment sessions, patient will verbalize at least 5 coping skills for substance abuse in one treatment session to decrease substance abuse post d/c. Outcome: Completed/Met Date Met: 12/05/15 Treatment Session 1; Completed 1 out of 2: At approximately 11:15 am, LRT met with patient in craft room. Patient verbalized 5 coping skills for substance abuse. LRT encouraged patient to use coping skills to help her stop using substances.  Leonette Monarch, LRT/CTRS 09.11.17 2:06 pm Goal: STG-Other Recreation Therapy Goal (Specify) STG: Stress Management - Within 3 treatment sessions, patient will verbalize understanding of the stress management techniques in one treatment session to increase stress management skills post d/c. Outcome: Completed/Met Date Met: 12/05/15 Treatment Session 1; Completed 1 out of 1: At approximately 11:15 am, LRT met with patient in craft room. Patient reported she read over and practiced the stress management techniques. Patient verbalized understanding and reported the techniques were helpful. LRT encouraged patient to continue practicing the stress management techniques.  Leonette Monarch, LRT/CTRS 09.11.17 2:07 pm

## 2015-12-05 NOTE — Plan of Care (Signed)
Problem: Medication: Goal: Compliance with prescribed medication regimen will improve Outcome: Progressing Pt has been compliant with prescribed medications   

## 2015-12-05 NOTE — Tx Team (Signed)
Interdisciplinary Treatment and Diagnostic Plan Update  12/05/2015 Time of Session:12:00pm Megan Shaffer MRN: [id number]  Principal Diagnosis: Bipolar 2 disorder, major depressive episode (HCC)  Secondary Diagnoses: Principal Problem:   Bipolar 2 disorder, major depressive episode (HCC) Active Problems:   Alcohol use disorder, moderate, dependence (HCC)   Seizure disorder (HCC)   Cocaine use disorder, moderate, dependence (HCC)   Substance induced mood disorder (HCC)   Tobacco use disorder   Opioid use disorder, moderate, dependence (HCC)   Current Medications:  Current Facility-Administered Medications  Medication Dose Route Frequency Provider Last Rate Last Dose  . acetaminophen (TYLENOL) tablet 650 mg  650 mg Oral Q6H PRN Jolanta B Pucilowska, MD      . alum & mag hydroxide-simeth (MAALOX/MYLANTA) 200-200-20 MG/5ML suspension 30 mL  30 mL Oral Q4H PRN Jolanta B Pucilowska, MD      . furosemide (LASIX) tablet 20 mg  20 mg Oral Daily PRN Alexis Hugelmeyer, DO      . furosemide (LASIX) tablet 20 mg  20 mg Oral Once Jolanta B Pucilowska, MD      . gabapentin (NEURONTIN) capsule 1,800 mg  1,800 mg Oral BID Shari ProwsJolanta B Pucilowska, MD   1,800 mg at 12/05/15 0827  . magnesium hydroxide (MILK OF MAGNESIA) suspension 30 mL  30 mL Oral Daily PRN Jolanta B Pucilowska, MD      . nicotine (NICODERM CQ - dosed in mg/24 hours) patch 21 mg  21 mg Transdermal Daily Jolanta B Pucilowska, MD   21 mg at 12/05/15 0828  . pantoprazole (PROTONIX) EC tablet 40 mg  40 mg Oral Daily Jolanta B Pucilowska, MD   40 mg at 12/05/15 0827  . phenytoin (DILANTIN) ER capsule 300 mg  300 mg Oral QHS Jolanta B Pucilowska, MD   300 mg at 12/04/15 2110  . QUEtiapine (SEROQUEL) tablet 300 mg  300 mg Oral QHS Shari ProwsJolanta B Pucilowska, MD   300 mg at 12/04/15 2108  . simethicone (MYLICON) chewable tablet 80 mg  80 mg Oral QID PRN Shari ProwsJolanta B Pucilowska, MD   80 mg at 12/05/15 0827  . traZODone (DESYREL) tablet 100 mg  100 mg Oral  QHS PRN Shari ProwsJolanta B Pucilowska, MD   100 mg at 12/04/15 2108   Current Outpatient Prescriptions  Medication Sig Dispense Refill  . furosemide (LASIX) 20 MG tablet Take 1 tablet (20 mg total) by mouth daily as needed for fluid or edema. 30 tablet 1  . gabapentin (NEURONTIN) 600 MG tablet Take 3 tablets (1,800 mg total) by mouth 2 (two) times daily. 180 tablet 1  . pantoprazole (PROTONIX) 40 MG tablet Take 1 tablet (40 mg total) by mouth daily. 30 tablet 1  . phenytoin (DILANTIN) 100 MG ER capsule Take 3 capsules (300 mg total) by mouth at bedtime. 30 capsule 1  . QUEtiapine (SEROQUEL) 300 MG tablet Take 1 tablet (300 mg total) by mouth at bedtime. 30 tablet 1  . traZODone (DESYREL) 100 MG tablet Take 1 tablet (100 mg total) by mouth at bedtime as needed for sleep. 30 tablet 1   PTA Medications: No prescriptions prior to admission.   Treatment Modalities: Medication Management, Group therapy, Case management,  1 to 1 session with clinician, Psychoeducation, Recreational therapy.   Physician Treatment Plan for Primary Diagnosis: Bipolar 2 disorder, major depressive episode (HCC) Long Term Goal(s): Improvement in symptoms so as ready for discharge   Short Term Goals: Ability to identify changes in lifestyle to reduce recurrence of condition will improve, Ability  to demonstrate self-control will improve, Ability to identify and develop effective coping behaviors will improve, Ability to maintain clinical measurements within normal limits will improve, Compliance with prescribed medications will improve and Ability to identify triggers associated with substance abuse/mental health issues will improve  Medication Management: Evaluate patient's response, side effects, and tolerance of medication regimen.  Therapeutic Interventions: 1 to 1 sessions, Unit Group sessions and Medication administration.  Evaluation of Outcomes: Adequate for Discharge  Physician Treatment Plan for Secondary  Diagnosis: Principal Problem:   Bipolar 2 disorder, major depressive episode (HCC) Active Problems:   Alcohol use disorder, moderate, dependence (HCC)   Seizure disorder (HCC)   Cocaine use disorder, moderate, dependence (HCC)   Substance induced mood disorder (HCC)   Alcohol hallucinosis (HCC)   Tobacco use disorder   Opioid use disorder, moderate, dependence (HCC)  Long Term Goal(s): Improvement in symptoms so as ready for discharge  Short Term Goals: Ability to identify changes in lifestyle to reduce recurrence of condition will improve, Ability to demonstrate self-control will improve, Ability to identify and develop effective coping behaviors will improve, Ability to maintain clinical measurements within normal limits will improve, Compliance with prescribed medications will improve and Ability to identify triggers associated with substance abuse/mental health issues will improve  Medication Management: Evaluate patient's response, side effects, and tolerance of medication regimen.  Therapeutic Interventions: 1 to 1 sessions, Unit Group sessions and Medication administration.  Evaluation of Outcomes: Adequate for Discharge   RN Treatment Plan for Primary Diagnosis: Bipolar 2 disorder, major depressive episode (HCC) Long Term Goal(s): Knowledge of disease and therapeutic regimen to maintain health will improve  Short Term Goals:  Ability to verbalize frustration and anger appropriately will improve, Ability to demonstrate self-control and Ability to participate in decision making will improve  Medication Management: RN will administer medications as ordered by provider, will assess and evaluate patient's response and provide education to patient for prescribed medication. RN will report any adverse and/or side effects to prescribing provider.  Therapeutic Interventions: 1 on 1 counseling sessions, Psychoeducation, Medication administration, Evaluate responses to treatment,  Monitor vital signs and CBGs as ordered, Perform/monitor CIWA, COWS, AIMS and Fall Risk screenings as ordered, Perform wound care treatments as ordered.  Evaluation of Outcomes: Adequate for Discharge   LCSW Treatment Plan for Primary Diagnosis: Bipolar 2 disorder, major depressive episode (HCC) Long Term Goal(s): Safe transition to appropriate next level of care at discharge, Engage patient in therapeutic group addressing interpersonal concerns.  Short Term Goals: Engage patient in aftercare planning with referrals and resources, Increase emotional regulation, Facilitate acceptance of mental health diagnosis and concerns, Facilitate patient progression through stages of change regarding substance use diagnoses and concerns, Identify triggers associated with mental health/substance abuse issues and Increase skills for wellness and recovery  Therapeutic Interventions: Assess for all discharge needs, 1 to 1 time with Social worker, Explore available resources and support systems, Assess for adequacy in community support network, Educate family and significant other(s) on suicide prevention, Complete Psychosocial Assessment, Interpersonal group therapy.  Evaluation of Outcomes: Adequate for Discharge   Progress in Treatment: Attending groups: Yes. Participating in groups: Yes. Taking medication as prescribed: Yes. Toleration medication: Yes. Family/Significant other contact made: No, Patient refused for family/support contact.  Patient understands diagnosis: Yes. Discussing patient identified problems/goals with staff: Yes. Medical problems stabilized or resolved: Yes. Denies suicidal/homicidal ideation: Yes. Issues/concerns per patient self-inventory: No. Other: n/a  New problem(s) identified: None identified.  New Short Term/Long Term Goal(s): None identified  Discharge Plan or Barriers: Patient will have follow-care with [facility].   Reason for Continuation of  Hospitalization: None  Estimated Length of Stay: 0 days  Attendees: Patient:Megan Shaffer 12/05/2015 2:34 PM  Physician: Kristine Linea, MD 12/05/2015 2:34 PM  Nursing:  12/05/2015 2:34 PM  RN Care Manager: 12/05/2015 2:34 PM  Social Worker: Jake Shark, LCSW 12/05/2015 2:34 PM  Recreational Therapist:  12/05/2015 2:34 PM  Other:  12/05/2015 2:34 PM  Other:  12/05/2015 2:34 PM  Other: 12/05/2015 2:34 PM    Scribe for Treatment Team: Glennon Mac, LCSW 12/05/2015 2:34 PM

## 2015-12-05 NOTE — Progress Notes (Signed)
Recreation Therapy Notes  INPATIENT RECREATION TR PLAN  Patient Details Name: Megan Shaffer MRN: [id number] DOB: 1970-06-18 Today's Date: 12/05/2015  Rec Therapy Plan Is patient appropriate for Therapeutic Recreation?: Yes Treatment times per week: At least once a week TR Treatment/Interventions: 1:1 session, Group participation (Comment)  Discharge Criteria Pt will be discharged from therapy if:: Treatment goals are met, Discharged Treatment plan/goals/alternatives discussed and agreed upon by:: Patient/family  Discharge Summary Short term goals set: See Care Plan Short term goals met: Complete Progress toward goals comments: One-to-one attended Which groups?: Self-esteem, Social skills One-to-one attended: Stress management, coping skills Reason goals not met: N/A Therapeutic equipment acquired: None Reason patient discharged from therapy: Discharge from hospital Pt/family agrees with progress & goals achieved: Yes Date patient discharged from therapy: 12/05/15   Leonette Monarch, LRT/CTRS 12/05/2015, 2:09 PM

## 2015-12-05 NOTE — Progress Notes (Signed)
  [facility] Discharge [facility] :  Will you be returning to the same living situation after discharge:  No.Plans to live with a friend or at the shelter At discharge, do you have transportation home?: Yes,  provided cab [facility] and [facility] pass to shelter Do you have the ability to pay for your medications: Yes,     Release of information consent forms completed and in the chart;  Patient's signature needed at discharge.  Patient to Follow up at: Follow-up Information    [facility] .   Why:  Please present between walk-in hours Monday-Friday 9am-4pm. Bring a photo ID and discharge paperwork to the initial appointment at which you will be established for medication management and therapy services. Contact information: [address] [place], [place] [address] [phone]          Next level of care provider has access to [facility] Link:no  Safety Planning and Suicide Prevention discussed: Yes,        Has patient been referred to the Quitline?: Patient refused referral  Patient has been referred for addiction treatment: Yes  Glennon MacSara P Havish Petties, MSW, LCSW 12/05/2015, 2:37 PM

## 2015-12-05 NOTE — [facility] Group Notes (Signed)
[facility] LCSW Group Therapy   12/05/2015 9:30am Type of Therapy: Group Therapy: Overcoming Obstacles   Participation Level: Invited but did not attend.   Participation Quality: Invited but did not attend.  [place] Kretz, MSW, LCSWA     

## 2015-12-05 NOTE — B[facility] Suicide Risk Assessment (Signed)
[facility]   Principal Problem: Bipolar 2 disorder, major depressive episode [facility](HCC) Discharge Diagnoses:  Patient Active Problem List   Diagnosis Date Noted  . Bipolar 2 disorder, major depressive episode (HCC) [F31.81] 11/29/2015  . Opioid use disorder, moderate, dependence (HCC) [F11.20] 11/29/2015  . Alcohol use disorder, moderate, dependence (HCC) [F10.20] 11/28/2015  . Seizure disorder (HCC) [G40.909] 11/28/2015  . Cocaine use disorder, moderate, dependence (HCC) [F14.20] 11/28/2015  . Substance induced mood disorder (HCC) [F19.94] 11/28/2015  . Tobacco use disorder [F17.200] 11/28/2015    Total Time spent with patient: 30 minutes  Musculoskeletal: Strength & Muscle Tone: within normal limits Gait & Station: normal Patient leans: N/A  Psychiatric Specialty Exam: Review of Systems  All other systems reviewed and are negative.   Blood pressure 106/64, pulse 88, temperature 98.7 F (37.1 C), temperature source Oral, resp. rate 18, height 5\' 9"  (1.753 m), weight 72.6 kg (160 lb), last menstrual period 11/20/2015, SpO2 95 %.Body mass index is 23.63 kg/m.  General Appearance: Casual  Eye Contact::  Good  Speech:  Clear and Coherent409  Volume:  Normal  Mood:  Anxious  Affect:  Appropriate  Thought Process:  Goal Directed  Orientation:  Full (Time, Place, and Person)  Thought Content:  WDL  Suicidal Thoughts:  No  Homicidal Thoughts:  No  Memory:  Immediate;   Fair Recent;   Fair Remote;   Fair  Judgement:  Impaired  Insight:  Lacking  Psychomotor Activity:  Normal  Concentration:  Fair  Recall:  [employer]Fair  Fund of Knowledge:Fair  Language: Fair  Akathisia:  No  Handed:  Right  AIMS (if indicated):     Assets:  Communication Skills Desire for Improvement Financial Resources/Insurance Physical Health Resilience  Sleep:  Number of Hours: 6  Cognition: WNL  ADL's:  Intact   Mental Status Per Nursing Assessment::   On Admission:      Demographic Factors:  Caucasian and Low socioeconomic status  Loss Factors: Financial problems/change in socioeconomic status  Historical Factors: Prior suicide attempts, Family history of mental illness or substance abuse and Impulsivity  Risk Reduction Factors:   Sense of responsibility to family  Continued Clinical Symptoms:  Bipolar Disorder:   Depressive phase Depression:   Comorbid alcohol abuse/dependence Impulsivity Alcohol/Substance Abuse/Dependencies  Cognitive Features That Contribute To Risk:  None    Suicide Risk:  Minimal: No identifiable suicidal ideation.  Patients presenting with no risk factors but with morbid ruminations; may be classified as minimal risk based on the severity of the depressive symptoms  Follow-up Information    [facility] .   Why:  Please present between walk-in hours Monday-Friday 9am-4pm. Bring a photo ID and discharge paperwork to the initial appointment at which you will be established for medication management and therapy services. Contact information: [address] [place], [place] [address] [phone]          Plan Of Care/Follow-up recommendations:  Activity:  As tolerated. Diet:  Low sodium heart healthy. Other:  Keep follow-up appointments.  Kristine LineaJolanta Machele Deihl, MD 12/05/2015, 8:57 AM

## 2015-12-10 ENCOUNTER — Emergency Department
Admission: EM | Admit: 2015-12-10 | Discharge: 2015-12-10 | Disposition: A | Discharge status: Home / Self Care | Payer: Medicare Other | Attending: Emergency Medicine | Admitting: Emergency Medicine

## 2015-12-10 ENCOUNTER — Emergency Department: Payer: Medicare Other

## 2015-12-10 DIAGNOSIS — G40909 Epilepsy, unspecified, not intractable, without status epilepticus: Secondary | ICD-10-CM | POA: Diagnosis not present

## 2015-12-10 DIAGNOSIS — F17210 Nicotine dependence, cigarettes, uncomplicated: Secondary | ICD-10-CM | POA: Insufficient documentation

## 2015-12-10 DIAGNOSIS — R569 Unspecified convulsions: Secondary | ICD-10-CM

## 2015-12-10 LAB — COMPREHENSIVE METABOLIC PANEL
ALT: 25 U/L (ref 14–54)
ANION GAP: 6 (ref 5–15)
AST: 22 U/L (ref 15–41)
Albumin: 4.1 g/dL (ref 3.5–5.0)
Alkaline Phosphatase: 71 U/L (ref 38–126)
BUN: 12 mg/dL (ref 6–20)
CHLORIDE: 111 mmol/L (ref 101–111)
CO2: 25 mmol/L (ref 22–32)
Calcium: 8.5 mg/dL — ABNORMAL LOW (ref 8.9–10.3)
Creatinine, Ser: 0.77 mg/dL (ref 0.44–1.00)
GFR calc non Af Amer: >60 mL/min (ref >60)
Glucose, Bld: 107 mg/dL — ABNORMAL HIGH (ref 65–99)
Potassium: 3.7 mmol/L (ref 3.5–5.1)
SODIUM: 142 mmol/L (ref 135–145)
Total Bilirubin: 0.2 mg/dL — ABNORMAL LOW (ref 0.3–1.2)
Total Protein: 7.3 g/dL (ref 6.5–8.1)

## 2015-12-10 LAB — CBC WITH DIFFERENTIAL/PLATELET
Basophils Absolute: 0.2 10*3/uL — ABNORMAL HIGH (ref 0–0.1)
Basophils Relative: 2 %
EOS ABS: 0.5 10*3/uL (ref 0–0.7)
EOS PCT: 3 %
HCT: 38.7 % (ref 35.0–47.0)
Hemoglobin: 13.2 g/dL (ref 12.0–16.0)
LYMPHS ABS: 3.8 10*3/uL — AB (ref 1.0–3.6)
Lymphocytes Relative: 28 %
MCH: 28.3 pg (ref 26.0–34.0)
MCHC: 34.2 g/dL (ref 32.0–36.0)
MCV: 82.7 fL (ref 80.0–100.0)
Monocytes Absolute: 0.9 10*3/uL (ref 0.2–0.9)
Monocytes Relative: 6 %
Neutro Abs: 8.4 10*3/uL — ABNORMAL HIGH (ref 1.4–6.5)
Neutrophils Relative %: 61 %
PLATELETS: 348 10*3/uL (ref 150–440)
RBC: 4.68 MIL/uL (ref 3.80–5.20)
RDW: 12.4 % (ref 11.5–14.5)
WBC: 13.7 10*3/uL — AB (ref 3.6–11.0)

## 2015-12-10 LAB — MAGNESIUM: MAGNESIUM: 2.2 mg/dL (ref 1.7–2.4)

## 2015-12-10 LAB — ACETAMINOPHEN LEVEL

## 2015-12-10 LAB — SALICYLATE LEVEL

## 2015-12-10 LAB — AMMONIA: AMMONIA: 31 umol/L (ref 9–35)

## 2015-12-10 LAB — ETHANOL: Alcohol, Ethyl (B): 205 mg/dL — ABNORMAL HIGH (ref <5)

## 2015-12-10 MED ORDER — SODIUM CHLORIDE 0.9 % IV SOLN
1000.0000 mg | Freq: Once | INTRAVENOUS | Status: AC
  Administered 2015-12-10: 1000 mg via INTRAVENOUS
  Filled 2015-12-10: qty 10

## 2015-12-10 MED ORDER — BACITRACIN ZINC 500 UNIT/GM EX OINT
TOPICAL_OINTMENT | CUTANEOUS | Status: AC
  Filled 2015-12-10: qty 0.9

## 2015-12-10 MED ORDER — LORAZEPAM 2 MG/ML IJ SOLN
2.0000 mg | Freq: Once | INTRAMUSCULAR | Status: AC
  Administered 2015-12-10: 2 mg via INTRAVENOUS

## 2015-12-10 MED ORDER — PHENYTOIN SODIUM EXTENDED 100 MG PO CAPS: 100.0000 mg | ORAL_CAPSULE | Freq: Three times a day (TID) | ORAL | 2 refills | Status: DC

## 2015-12-10 MED ORDER — SODIUM CHLORIDE 0.9 % IV BOLUS (SEPSIS)
500.0000 mL | Freq: Once | INTRAVENOUS | Status: AC
  Administered 2015-12-10: 500 mL via INTRAVENOUS

## 2015-12-10 NOTE — ED Provider Notes (Signed)
-----------------------------------------   8:21 AM on 12/10/2015 -----------------------------------------  Patient is awake and alert. He has walked to the bathroom without issue. Is currently ranking soda. She is attempting to call someone to pick her up. She states she wants to leave the emergency department. Patient's alcohol level was 205 at 1 AM that was over 7 hours ago. As the patient appears well currently, alert oriented, ambulating without difficulty and believe the patient is safe for discharge home now. I encouraged the patient to wait until her ride arrives before leaving the emergency department. Patient is agreeable and is currently lying in the bed eating/drinking.   Minna AntisKevin Sierah Lacewell, MD 12/10/15 [phone]

## 2015-12-10 NOTE — Discharge Instructions (Signed)
No driving or any activity that would be of concern if he had a repeat seizure. Please take the Dilantin as prescribed and drink plenty of fluids and avoid alcohol and illicit substances.  Please return immediately if condition worsens. Please contact her primary physician or the physician you were given for referral. If you have any specialist physicians involved in her treatment and plan please also contact them. Thank you for using  regional emergency Department.

## 2015-12-10 NOTE — ED Triage Notes (Signed)
EMS states that family reported pt having 6 seizures prior to their arrival.  Since EMS arrived pt has had 2 seizures including 1 upon arrival to ER.  Pt had 4mg  versed IM in EMS truck.  Pt has history of seizures and alcohol.

## 2015-12-10 NOTE — ED Notes (Signed)
EDP notified of blood pressure, but pt sleeping on side and refusing to turn over onto back to get proper blood pressure.  Pt responds to voice at this time, but continues to sleep and refusing to hold conversation.

## 2015-12-10 NOTE — ED Notes (Signed)
Pt started having active seizure, gave lorazepam per EDP order placed.

## 2015-12-10 NOTE — ED Provider Notes (Signed)
Time Seen: Approximately *1210  I have reviewed the triage notes  Chief Complaint: Seizures   History of Present Illness: Megan Shaffer is a 45 y.o. female who was transported here by EMS after he was reported that she had multiple seizures at home prior to arrival. EMS arrived and states that the patient had 2 seizures 1 upon arrival here to emergency department in transport she received 4 mg of Versed IM. The patient has a history of seizures and polysubstance abuse. And has a empty bottle of Dilantin with her. The patient's inebriated and unable to give any consistent history or review of systems.  Past Medical History:  Diagnosis Date  . Hepatitis C   . Seizures (HCC)     Patient Active Problem List   Diagnosis Date Noted  . Bipolar 2 disorder, major depressive episode (HCC) 11/29/2015  . Opioid use disorder, moderate, dependence (HCC) 11/29/2015  . Alcohol use disorder, moderate, dependence (HCC) 11/28/2015  . Seizure disorder (HCC) 11/28/2015  . Cocaine use disorder, moderate, dependence (HCC) 11/28/2015  . Substance induced mood disorder (HCC) 11/28/2015  . Tobacco use disorder 11/28/2015    Past Surgical History:  Procedure Laterality Date  . ABDOMINAL SURGERY    . CHOLECYSTECTOMY    . OOPHORECTOMY    . OVARIAN CYST SURGERY    . TONSILLECTOMY    . TUBAL LIGATION      Past Surgical History:  Procedure Laterality Date  . ABDOMINAL SURGERY    . CHOLECYSTECTOMY    . OOPHORECTOMY    . OVARIAN CYST SURGERY    . TONSILLECTOMY    . TUBAL LIGATION      Current Outpatient Rx  . Order #: [id number] Class: Print  . Order #: [id number] Class: Print  . Order #: [id number] Class: Print  . Order #: [id number] Class: Print  . Order #: [id number] Class: Print  . Order #: [id number] Class: Print    Allergies:  Ciprofloxacin; Famotidine; Geodon [ziprasidone hcl]; Imitrex [sumatriptan]; Toradol [ketorolac tromethamine]; and Zofran [ondansetron hcl]  Family History: Family  History  Problem Relation Age of Onset  . Cancer Mother   . Healthy Father     Social History: Social History  Substance Use Topics  . Smoking status: Current Every Day Smoker    Packs/day: 1.50    Types: Cigarettes  . Smokeless tobacco: Never Used  . Alcohol use Yes     Comment: Fifth of liquor daily      Review of Systems:   10 point review of systems was performed and was otherwise negative:  Constitutional: No fever Eyes: No visual disturbances ENT: No sore throat, ear pain Cardiac: No chest pain Respiratory: No shortness of breath, wheezing, or stridor Abdomen: No abdominal pain, no vomiting, No diarrhea Endocrine: No weight loss, No night sweats Extremities: No peripheral edema, cyanosis Skin: No rashes, easy bruising Neurologic: No focal weakness, trouble with speech or swollowing Urologic: No dysuria, Hematuria, or urinary frequency   Physical Exam:  ED Triage Vitals  Enc Vitals Group     BP 12/10/15 0400 118/73     Pulse Rate 12/10/15 0400 90     Resp 12/10/15 0400 16     Temp --      Temp src --      SpO2 12/10/15 0400 95 %     Weight 12/10/15 0023 161 lb (73 kg)     Height 12/10/15 0023 5\' 9"  (1.753 m)     Head Circumference --      Peak Flow --  Pain Score --      Pain Loc --      Pain Edu? --      Excl. in GC? --     General: Awake , Alert , and Oriented times 3; Patient's inebriated but able to answer some questions appropriately GCS 15 Head: Normal cephalic , atraumatic Eyes: Pupils equal , round, reactive to light Nose/Throat: No nasal drainage, patent upper airway without erythema or exudate.  Neck: Supple, Full range of motion, No anterior adenopathy or palpable thyroid masses Lungs: Clear to ascultation without wheezes , rhonchi, or rales Heart: Regular rate, regular rhythm without murmurs , gallops , or rubs Abdomen: Soft, non tender without rebound, guarding , or rigidity; bowel sounds positive and symmetric in all 4 quadrants. No  organomegaly .        Extremities: 2 plus symmetric pulses. No edema, clubbing or cyanosis Neurologic: normal ambulation, Motor symmetric without deficits, sensory intact Skin: warm, dry, no rashes   Labs:   All laboratory work was reviewed including any pertinent negatives or positives listed below:  Labs Reviewed  CBC WITH DIFFERENTIAL/PLATELET - Abnormal; Notable for the following:       Result Value   WBC 13.7 (*)    Neutro Abs 8.4 (*)    Lymphs Abs 3.8 (*)    Basophils Absolute 0.2 (*)    All other components within normal limits  COMPREHENSIVE METABOLIC PANEL - Abnormal; Notable for the following:    Glucose, Bld 107 (*)    Calcium 8.5 (*)    Total Bilirubin 0.2 (*)    All other components within normal limits  ETHANOL - Abnormal; Notable for the following:    Alcohol, Ethyl (B) 205 (*)    All other components within normal limits  ACETAMINOPHEN LEVEL - Abnormal; Notable for the following:    Acetaminophen (Tylenol), Serum <10 (*)    All other components within normal limits  SALICYLATE LEVEL  AMMONIA  MAGNESIUM  URINE DRUG SCREEN, QUALITATIVE ([facility] ONLY)  POC URINE PREG, ED    EKG: ED ECG REPORT I, Jennye Moccasin, the attending physician, personally viewed and interpreted this ECG.  Date: 12/10/2015 EKG Time: 0137 Rate: 86 Rhythm: normal sinus rhythm QRS Axis: normal Intervals: normal ST/T Wave abnormalities: normal Conduction Disturbances: none Narrative Interpretation: unremarkable    Radiology:   Head CT was read as normal by the radiologist   I personally reviewed the radiologic studies  ED Course: * Patient's stay here was uneventful and she had further episodes of what I witnessed to be pseudoseizures. The patient has what appears to be a tonic reaction which last couple of seconds and then spontaneously wakes and does not appear to have any postictal period or any significant loss of consciousness. No persistent tonic-clonic activity or  anything that would concern for status epilepticus. 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 Patient was advised to follow up with their primary care physician or other specialized physicians involved in their outpatient care. The patient and/or family member/power of attorney had laboratory results reviewed at the bedside. All questions and concerns were addressed and appropriate discharge instructions were distributed by the nursing staff.             Jennye Moccasin, MD 12/10/15 [phone]

## 2016-01-01 ENCOUNTER — Other Ambulatory Visit: Payer: Self-pay | Admitting: Psychiatry

## 2016-07-25 ENCOUNTER — Emergency Department
Admission: EM | Admit: 2016-07-25 | Discharge: 2016-07-25 | Disposition: A | Discharge status: Home / Self Care | Payer: Medicare Other | Attending: Emergency Medicine | Admitting: Emergency Medicine

## 2016-07-25 ENCOUNTER — Encounter: Payer: Self-pay | Admitting: Emergency Medicine

## 2016-07-25 ENCOUNTER — Emergency Department: Payer: Medicare Other

## 2016-07-25 DIAGNOSIS — L03116 Cellulitis of left lower limb: Secondary | ICD-10-CM | POA: Diagnosis not present

## 2016-07-25 DIAGNOSIS — T63301A Toxic effect of unspecified spider venom, accidental (unintentional), initial encounter: Secondary | ICD-10-CM | POA: Diagnosis present

## 2016-07-25 DIAGNOSIS — F17210 Nicotine dependence, cigarettes, uncomplicated: Secondary | ICD-10-CM | POA: Insufficient documentation

## 2016-07-25 DIAGNOSIS — L0390 Cellulitis, unspecified: Secondary | ICD-10-CM

## 2016-07-25 LAB — CBC WITH DIFFERENTIAL/PLATELET
BASOS ABS: 0.1 10*3/uL (ref 0–0.1)
BASOS PCT: 1 %
EOS ABS: 0.2 10*3/uL (ref 0–0.7)
Eosinophils Relative: 1 %
HCT: 41.4 % (ref 35.0–47.0)
HEMOGLOBIN: 14 g/dL (ref 12.0–16.0)
Lymphocytes Relative: 17 %
Lymphs Abs: 2.7 10*3/uL (ref 1.0–3.6)
MCH: 26.4 pg (ref 26.0–34.0)
MCHC: 33.9 g/dL (ref 32.0–36.0)
MCV: 78 fL — ABNORMAL LOW (ref 80.0–100.0)
Monocytes Absolute: 1.3 10*3/uL — ABNORMAL HIGH (ref 0.2–0.9)
Monocytes Relative: 8 %
NEUTROS PCT: 73 %
Neutro Abs: 11.5 10*3/uL — ABNORMAL HIGH (ref 1.4–6.5)
Platelets: 374 10*3/uL (ref 150–440)
RBC: 5.31 MIL/uL — AB (ref 3.80–5.20)
RDW: 13.5 % (ref 11.5–14.5)
WBC: 15.8 10*3/uL — AB (ref 3.6–11.0)

## 2016-07-25 LAB — COMPREHENSIVE METABOLIC PANEL
ALK PHOS: 86 U/L (ref 38–126)
ALT: 24 U/L (ref 14–54)
ANION GAP: 7 (ref 5–15)
AST: 26 U/L (ref 15–41)
Albumin: 4.2 g/dL (ref 3.5–5.0)
BUN: 10 mg/dL (ref 6–20)
CALCIUM: 8.7 mg/dL — AB (ref 8.9–10.3)
CO2: 22 mmol/L (ref 22–32)
CREATININE: 0.69 mg/dL (ref 0.44–1.00)
Chloride: 104 mmol/L (ref 101–111)
Glucose, Bld: 112 mg/dL — ABNORMAL HIGH (ref 65–99)
Potassium: 3.3 mmol/L — ABNORMAL LOW (ref 3.5–5.1)
Sodium: 133 mmol/L — ABNORMAL LOW (ref 135–145)
TOTAL PROTEIN: 7.9 g/dL (ref 6.5–8.1)
Total Bilirubin: 0.4 mg/dL (ref 0.3–1.2)

## 2016-07-25 LAB — LACTIC ACID, PLASMA: LACTIC ACID, VENOUS: 1.4 mmol/L (ref 0.5–1.9)

## 2016-07-25 MED ORDER — DOXYCYCLINE HYCLATE 100 MG PO TABS
100.0000 mg | ORAL_TABLET | Freq: Once | ORAL | Status: AC
  Administered 2016-07-25: 100 mg via ORAL

## 2016-07-25 MED ORDER — DOXYCYCLINE HYCLATE 100 MG PO TABS
ORAL_TABLET | ORAL | Status: AC
  Filled 2016-07-25: qty 1

## 2016-07-25 MED ORDER — DOXYCYCLINE HYCLATE 100 MG PO CAPS: 100.0000 mg | ORAL_CAPSULE | Freq: Two times a day (BID) | ORAL | 0 refills | Status: DC

## 2016-07-25 NOTE — ED Triage Notes (Signed)
Pt in via POV with complaints of brown recluse spider bite to left ankle approximately 2 months ago, states she has finished two rounds of antibiotics, bite area seems to be getting worse, yellow-green drainage noted to dressing.  Pt reports pain to bilateral legs up to hips, reports N/V x 3 days days.  Ambulatory to triage without difficulty, NAD noted at this time.

## 2016-07-25 NOTE — Discharge Instructions (Signed)
Please seek medical attention for any high fevers, chest pain, shortness of breath, change in behavior, persistent vomiting, bloody stool or any other new or concerning symptoms.  

## 2016-07-25 NOTE — ED Provider Notes (Signed)
[facility] Emergency Department Provider Note _____________________________________   I have reviewed the triage vital signs and the nursing notes.   HISTORY  Chief Complaint Insect Bite   History limited by: Not Limited   HPI Megan Shaffer is a 46 y.o. female who presents to the emergency department today because of concerns for wound to leg which has not yet healed. The patient states that she got this 2 months ago. She states that she was bit by a spider. Is like she did not directly visualize a spider bite her however felt a stinging and then looked down and saw a brown spider. The patient states that the brown spider had an hourglass figure on its back. She states that she was in [employer] when this happened. Since being bitten she states that she has had two rounds of antibiotics without significant improvement, finishing the most recent antibiotic about one month ago. She has more recently noticed some associated nausea and vomiting as well as fevers.   Past Medical History:  Diagnosis Date  . Hepatitis C   . Seizures (HCC)     Patient Active Problem List   Diagnosis Date Noted  . Bipolar 2 disorder, major depressive episode (HCC) 11/29/2015  . Opioid use disorder, moderate, dependence (HCC) 11/29/2015  . Alcohol use disorder, moderate, dependence (HCC) 11/28/2015  . Seizure disorder (HCC) 11/28/2015  . Cocaine use disorder, moderate, dependence (HCC) 11/28/2015  . Substance induced mood disorder (HCC) 11/28/2015  . Tobacco use disorder 11/28/2015    Past Surgical History:  Procedure Laterality Date  . ABDOMINAL SURGERY    . CHOLECYSTECTOMY    . OOPHORECTOMY    . OVARIAN CYST SURGERY    . TONSILLECTOMY    . TUBAL LIGATION      Prior to Admission medications   Medication Sig Start Date End Date Taking? Authorizing Provider  furosemide (LASIX) 20 MG tablet Take 1 tablet (20 mg total) by mouth daily as needed for fluid or edema.  12/05/15   Shari Prows, MD  gabapentin (NEURONTIN) 600 MG tablet Take 3 tablets (1,800 mg total) by mouth 2 (two) times daily. 12/04/15   Shari Prows, MD  pantoprazole (PROTONIX) 40 MG tablet Take 1 tablet (40 mg total) by mouth daily. 12/04/15   Shari Prows, MD  phenytoin (DILANTIN) 100 MG ER capsule Take 1 capsule (100 mg total) by mouth 3 (three) times daily. 12/10/15 12/09/16  Jennye Moccasin, MD  QUEtiapine (SEROQUEL) 300 MG tablet Take 1 tablet (300 mg total) by mouth at bedtime. 12/04/15   Shari Prows, MD  traZODone (DESYREL) 100 MG tablet Take 1 tablet (100 mg total) by mouth at bedtime as needed for sleep. 12/04/15   Shari Prows, MD    Allergies Ciprofloxacin; Geodon [ziprasidone hcl]; Toradol [ketorolac tromethamine]; Famotidine; Imitrex [sumatriptan]; and Zofran [ondansetron hcl]  Family History  Problem Relation Age of Onset  . Cancer Mother   . Healthy Father     Social History Social History  Substance Use Topics  . Smoking status: Current Every Day Smoker    Packs/day: 2.00    Types: Cigarettes  . Smokeless tobacco: Never Used  . Alcohol use Yes     Comment: Fifth of liquor daily     Review of Systems Constitutional: Positive for fever Eyes: No visual changes. ENT: No sore throat. Cardiovascular: Denies chest pain. Respiratory: Denies shortness of breath. Gastrointestinal: No abdominal pain.  No nausea, no vomiting.  No  diarrhea.   Genitourinary: Negative for dysuria. Musculoskeletal: Positive for joint pain. Skin: Positive for wound to left lower leg. Neurological: Negative for headaches, focal weakness or numbness.  ____________________________________________   PHYSICAL EXAM:  VITAL SIGNS: ED Triage Vitals  Enc Vitals Group     BP 07/25/16 1824 133/82     Pulse Rate 07/25/16 1824 (!) 110     Resp 07/25/16 1824 16     Temp 07/25/16 1824 99.4 F (37.4 C)     Temp Source 07/25/16 1824 Oral     SpO2 07/25/16  1824 97 %     Weight 07/25/16 1825 152 lb (68.9 kg)     Height 07/25/16 1825  (1.727 m)     Head Circumference --      Peak Flow --      Pain Score 07/25/16 1829 8   Constitutional: Alert and oriented. Well appearing and in no distress. Eyes: Conjunctivae are normal. Normal extraocular movements. ENT   Head: Normocephalic and atraumatic.   Nose: No congestion/rhinnorhea.   Mouth/Throat: Mucous membranes are moist.   Neck: No stridor. Hematological/Lymphatic/Immunilogical: No cervical lymphadenopathy. Cardiovascular: Normal rate, regular rhythm.  No murmurs, rubs, or gallops. Respiratory: Normal respiratory effort without tachypnea nor retractions. Breath sounds are clear and equal bilaterally. No wheezes/rales/rhonchi. Gastrointestinal: Soft and non tender. No rebound. No guarding.  Genitourinary: Deferred Musculoskeletal: Normal range of motion in all extremities. No lower extremity edema. Neurologic:  Normal speech and language. No gross focal neurologic deficits are appreciated.  Skin:  Small roughly 1.5 cm wound to left ankle. No surrounding erythema. No pus. Psychiatric: Mood and affect are normal. Speech and behavior are normal. Patient exhibits appropriate insight and judgment.  ____________________________________________    LABS (pertinent positives/negatives)  Labs Reviewed  COMPREHENSIVE METABOLIC PANEL - Abnormal; Notable for the following:       Result Value   Sodium 133 (*)    Potassium 3.3 (*)    Glucose, Bld 112 (*)    Calcium 8.7 (*)    All other components within normal limits  CBC WITH DIFFERENTIAL/PLATELET - Abnormal; Notable for the following:    WBC 15.8 (*)    RBC 5.31 (*)    MCV 78.0 (*)    Neutro Abs 11.5 (*)    Monocytes Absolute 1.3 (*)    All other components within normal limits  LACTIC ACID, PLASMA  LACTIC ACID, PLASMA  URINALYSIS, COMPLETE (UACMP) WITH MICROSCOPIC     ____________________________________________   EKG  None  ____________________________________________    RADIOLOGY  None  ____________________________________________   PROCEDURES  Procedures  ____________________________________________   INITIAL IMPRESSION / ASSESSMENT AND PLAN / ED COURSE  Pertinent labs & imaging results that were available during my care of the patient were reviewed by me and considered in my medical decision making (see chart for details).  Patient presented to the emergency department today because of concerns for continued wound secondary to insect bite. She does have a small roughly 1-1/2 cm diameter wound on her leg with no real significant surrounding erythema. Patient does have slightly elevated leukocytosis. Lactic acid was normal. I did feel patient has reasonable to be treated as an outpatient. Less than a box for about a month ago. Will place patient on doxycycline. Additionally discussed with patient importance of follow-up with wound care clinic.  ____________________________________________   FINAL CLINICAL IMPRESSION(S) / ED DIAGNOSES  Final diagnoses:  Wound cellulitis     Note: This dictation was prepared with Dragon dictation. Any  transcriptional errors that result from this process are unintentional     Phineas Semen, MD 07/25/16 2233

## 2016-07-25 NOTE — ED Notes (Signed)
ED Provider at bedside. 

## 2017-08-04 ENCOUNTER — Emergency Department
Admission: EM | Admit: 2017-08-04 | Discharge: 2017-08-05 | Disposition: A | Discharge status: Home / Self Care | Payer: Medicare Other | Attending: Emergency Medicine | Admitting: Emergency Medicine

## 2017-08-04 ENCOUNTER — Emergency Department: Payer: Medicare Other

## 2017-08-04 ENCOUNTER — Encounter: Payer: Self-pay | Admitting: Emergency Medicine

## 2017-08-04 ENCOUNTER — Other Ambulatory Visit: Payer: Self-pay

## 2017-08-04 DIAGNOSIS — F17210 Nicotine dependence, cigarettes, uncomplicated: Secondary | ICD-10-CM | POA: Diagnosis not present

## 2017-08-04 DIAGNOSIS — R569 Unspecified convulsions: Secondary | ICD-10-CM

## 2017-08-04 DIAGNOSIS — Z79899 Other long term (current) drug therapy: Secondary | ICD-10-CM | POA: Insufficient documentation

## 2017-08-04 LAB — CBC WITH DIFFERENTIAL/PLATELET
BASOS PCT: 1 %
Basophils Absolute: 0.1 10*3/uL (ref 0–0.1)
EOS PCT: 3 %
Eosinophils Absolute: 0.2 10*3/uL (ref 0–0.7)
HEMATOCRIT: 37.7 % (ref 35.0–47.0)
Hemoglobin: 13 g/dL (ref 12.0–16.0)
LYMPHS PCT: 40 %
Lymphs Abs: 3.4 10*3/uL (ref 1.0–3.6)
MCH: 28.1 pg (ref 26.0–34.0)
MCHC: 34.6 g/dL (ref 32.0–36.0)
MCV: 81.3 fL (ref 80.0–100.0)
MONO ABS: 0.6 10*3/uL (ref 0.2–0.9)
MONOS PCT: 8 %
NEUTROS ABS: 4.1 10*3/uL (ref 1.4–6.5)
Neutrophils Relative %: 48 %
PLATELETS: 175 10*3/uL (ref 150–440)
RBC: 4.64 MIL/uL (ref 3.80–5.20)
RDW: 14.3 % (ref 11.5–14.5)
WBC: 8.5 10*3/uL (ref 3.6–11.0)

## 2017-08-04 LAB — COMPREHENSIVE METABOLIC PANEL
ALT: 17 U/L (ref 14–54)
ANION GAP: 9 (ref 5–15)
AST: 20 U/L (ref 15–41)
Albumin: 3.9 g/dL (ref 3.5–5.0)
Alkaline Phosphatase: 79 U/L (ref 38–126)
BILIRUBIN TOTAL: 0.2 mg/dL — AB (ref 0.3–1.2)
BUN: 11 mg/dL (ref 6–20)
CO2: 24 mmol/L (ref 22–32)
Calcium: 8.6 mg/dL — ABNORMAL LOW (ref 8.9–10.3)
Chloride: 101 mmol/L (ref 101–111)
Creatinine, Ser: 0.69 mg/dL (ref 0.44–1.00)
GFR calc Af Amer: >60 mL/min (ref >60)
Glucose, Bld: 107 mg/dL — ABNORMAL HIGH (ref 65–99)
POTASSIUM: 3.7 mmol/L (ref 3.5–5.1)
Sodium: 134 mmol/L — ABNORMAL LOW (ref 135–145)
Total Protein: 6.9 g/dL (ref 6.5–8.1)

## 2017-08-04 LAB — URINE DRUG SCREEN, QUALITATIVE (ARMC ONLY)
Amphetamines, Ur Screen: NOT DETECTED
BARBITURATES, UR SCREEN: NOT DETECTED
BENZODIAZEPINE, UR SCRN: POSITIVE — AB
Cannabinoid 50 Ng, Ur ~~LOC~~: NOT DETECTED
Cocaine Metabolite,Ur ~~LOC~~: NOT DETECTED
MDMA (Ecstasy)Ur Screen: NOT DETECTED
Methadone Scn, Ur: NOT DETECTED
OPIATE, UR SCREEN: NOT DETECTED
PHENCYCLIDINE (PCP) UR S: NOT DETECTED
Tricyclic, Ur Screen: NOT DETECTED

## 2017-08-04 LAB — URINALYSIS, COMPLETE (UACMP) WITH MICROSCOPIC
BILIRUBIN URINE: NEGATIVE
Bacteria, UA: NONE SEEN
GLUCOSE, UA: NEGATIVE mg/dL
Hgb urine dipstick: NEGATIVE
KETONES UR: NEGATIVE mg/dL
LEUKOCYTES UA: NEGATIVE
NITRITE: NEGATIVE
PH: 6 (ref 5.0–8.0)
Protein, ur: NEGATIVE mg/dL
Specific Gravity, Urine: 1.001 — ABNORMAL LOW (ref 1.005–1.030)
Squamous Epithelial / LPF: NONE SEEN (ref 0–5)

## 2017-08-04 LAB — HCG, QUANTITATIVE, PREGNANCY

## 2017-08-04 LAB — PHENYTOIN LEVEL, TOTAL

## 2017-08-04 MED ORDER — GABAPENTIN 300 MG PO CAPS
600.0000 mg | ORAL_CAPSULE | ORAL | Status: DC
  Filled 2017-08-04: qty 2

## 2017-08-04 MED ORDER — SODIUM CHLORIDE 0.9 % IV BOLUS
1000.0000 mL | Freq: Once | INTRAVENOUS | Status: AC
Start: 2017-08-04 — End: 2017-08-04
  Administered 2017-08-04: 1000 mL via INTRAVENOUS

## 2017-08-04 MED ORDER — PHENYTOIN SODIUM EXTENDED 100 MG PO CAPS: 100.0000 mg | ORAL_CAPSULE | Freq: Three times a day (TID) | ORAL | 0 refills | Status: AC

## 2017-08-04 MED ORDER — FENTANYL CITRATE (PF) 100 MCG/2ML IJ SOLN
INTRAMUSCULAR | Status: AC
  Administered 2017-08-04: 25 ug via INTRAVENOUS
  Filled 2017-08-04: qty 2

## 2017-08-04 MED ORDER — GABAPENTIN 600 MG PO TABS
600.0000 mg | ORAL_TABLET | ORAL | Status: AC
  Administered 2017-08-04: 600 mg via ORAL

## 2017-08-04 MED ORDER — PHENYTOIN 50 MG PO CHEW
400.0000 mg | CHEWABLE_TABLET | Freq: Three times a day (TID) | ORAL | Status: DC
  Administered 2017-08-04: 400 mg via ORAL
  Filled 2017-08-04: qty 8

## 2017-08-04 MED ORDER — FENTANYL CITRATE (PF) 100 MCG/2ML IJ SOLN
25.0000 ug | Freq: Once | INTRAMUSCULAR | Status: AC
  Administered 2017-08-04: 25 ug via INTRAVENOUS

## 2017-08-04 MED ORDER — GABAPENTIN 300 MG PO CAPS: 600.0000 mg | ORAL_CAPSULE | Freq: Three times a day (TID) | ORAL | 0 refills | Status: DC

## 2017-08-04 MED ORDER — GABAPENTIN 600 MG PO TABS
ORAL_TABLET | ORAL | Status: DC
Start: 2017-08-04 — End: 2017-08-05
  Filled 2017-08-04: qty 1

## 2017-08-04 MED ORDER — PHENYTOIN SODIUM 50 MG/ML IJ SOLN
500.0000 mg | Freq: Once | INTRAMUSCULAR | Status: AC
  Administered 2017-08-04: 500 mg via INTRAVENOUS
  Filled 2017-08-04: qty 10

## 2017-08-04 MED ORDER — LORAZEPAM 2 MG/ML IJ SOLN
1.0000 mg | Freq: Once | INTRAMUSCULAR | Status: AC
  Administered 2017-08-04: 1 mg via INTRAVENOUS
  Filled 2017-08-04: qty 1

## 2017-08-04 NOTE — ED Triage Notes (Addendum)
Pt presents to ED via [facility] from home c/o multiple seizures today, 3 witnessed by family and 1 witnessed by EMS, described as generalized tonic-clonic. Pt is somewhat confused on arrival but followed commands. Slurred speech. Denies injury to tongue or incontinence. Pt states she has been out of her meds for about a month. Arrives with IV placed to L ankle, EMS report x3 attempts to BUE and x1 attempt to EJ unsuccessfully. Pt arrives with bruising to L forehead, EMS report fall at home during seizure.

## 2017-08-04 NOTE — ED Provider Notes (Addendum)
[facility] Emergency Department Provider Note  ___________________________________________   First MD Initiated Contact with Patient 08/04/17 1854     (approximate)  I have reviewed the triage vital signs and the nursing notes.   HISTORY  Chief Complaint Seizures   HPI Megan Shaffer is a 47 y.o. female with history of hep C, bipolar disorder as well as seizure disorder who is presenting to the emergency department after multiple seizures at home.  EMS states that the patient had seized 3-4 times prior to the arrival and then seized an additional time in the truck during transportation.  EMS reports that the seizure in the truck lasted about 10 seconds and including generalized, tonic-clonic activity.  No reported loss of bowel or bladder continence.  The patient reports that she has not been on the full dose of her gabapentin nor has she been on the full dose of her Xanax ever since her primary care doctor was investigated for fraud.  Patient also reports being seen at [facility] emergency department several days ago for seizures.  Denies any drinking or drug use.  Placed in cervical collar by EMS secondary to the patient falling at home and complaining of posterior head pain as well as neck pain and thoracic back pain.  Patient denying any weakness or numbness at this time.  Past Medical History:  Diagnosis Date  . Hepatitis C   . Seizures (HCC)     Patient Active Problem List   Diagnosis Date Noted  . Bipolar 2 disorder, major depressive episode (HCC) 11/29/2015  . Opioid use disorder, moderate, dependence (HCC) 11/29/2015  . Alcohol use disorder, moderate, dependence (HCC) 11/28/2015  . Seizure disorder (HCC) 11/28/2015  . Cocaine use disorder, moderate, dependence (HCC) 11/28/2015  . Substance induced mood disorder (HCC) 11/28/2015  . Tobacco use disorder 11/28/2015    Past Surgical History:  Procedure Laterality Date  . ABDOMINAL SURGERY    .  CHOLECYSTECTOMY    . OOPHORECTOMY    . OVARIAN CYST SURGERY    . TONSILLECTOMY    . TUBAL LIGATION      Prior to Admission medications   Medication Sig Start Date End Date Taking? Authorizing Provider  doxycycline (VIBRAMYCIN) 100 MG capsule Take 1 capsule (100 mg total) by mouth 2 (two) times daily. 07/25/16   Phineas Semen, MD  furosemide (LASIX) 20 MG tablet Take 1 tablet (20 mg total) by mouth daily as needed for fluid or edema. 12/05/15   Pucilowska, Braulio Conte B, MD  gabapentin (NEURONTIN) 600 MG tablet Take 3 tablets (1,800 mg total) by mouth 2 (two) times daily. 12/04/15   Pucilowska, Jolanta B, MD  pantoprazole (PROTONIX) 40 MG tablet Take 1 tablet (40 mg total) by mouth daily. 12/04/15   Pucilowska, Braulio Conte B, MD  phenytoin (DILANTIN) 100 MG ER capsule Take 1 capsule (100 mg total) by mouth 3 (three) times daily. 12/10/15 12/09/16  Jennye Moccasin, MD  QUEtiapine (SEROQUEL) 300 MG tablet Take 1 tablet (300 mg total) by mouth at bedtime. 12/04/15   Pucilowska, Braulio Conte B, MD  traZODone (DESYREL) 100 MG tablet Take 1 tablet (100 mg total) by mouth at bedtime as needed for sleep. 12/04/15   Pucilowska, Ellin Goodie, MD    Allergies Ciprofloxacin; Geodon [ziprasidone hcl]; Toradol [ketorolac tromethamine]; Famotidine; Imitrex [sumatriptan]; and Zofran [ondansetron hcl]  Family History  Problem Relation Age of Onset  . Cancer Mother   . Healthy Father     Social History Social History  Tobacco Use  . Smoking status: Current Every Day Smoker    Packs/day: 2.00    Types: Cigarettes  . Smokeless tobacco: Never Used  Substance Use Topics  . Alcohol use: Yes    Comment: Fifth of liquor daily   . Drug use: Yes    Types: IV, Cocaine, Heroin    Comment: also Heroin    Review of Systems  Constitutional: No fever/chills Eyes: No visual changes. ENT: No sore throat. Cardiovascular: Denies chest pain. Respiratory: Denies shortness of breath. Gastrointestinal: No abdominal pain.  No  nausea, no vomiting.  No diarrhea.  No constipation. Genitourinary: Negative for dysuria. Musculoskeletal: As above Skin: Negative for rash. Neurological: Negative for focal weakness or numbness.   ____________________________________________   PHYSICAL EXAM:  VITAL SIGNS: ED Triage Vitals  Enc Vitals Group     BP 08/04/17 1850 (!) 154/99     Pulse Rate 08/04/17 1850 97     Resp 08/04/17 1850 14     Temp --      Temp src --      SpO2 08/04/17 1850 98 %     Weight 08/04/17 1851 140 lb (63.5 kg)     Height 08/04/17 1851  (1.626 m)     Head Circumference --      Peak Flow --      Pain Score --      Pain Loc --      Pain Edu? --      Excl. in GC? --     Constitutional: Alert and oriented. Well appearing and in no acute distress. Eyes: Conjunctivae are normal.  Head: Atraumatic. Nose: No congestion/rhinnorhea. Mouth/Throat: Mucous membranes are moist.  Neck: No stridor.  Patient wearing cervical collar. Cardiovascular: Normal rate, regular rhythm. Grossly normal heart sounds.   Respiratory: Normal respiratory effort.  No retractions. Lungs CTAB. Gastrointestinal: Soft and nontender. No distention. No CVA tenderness. Musculoskeletal: No lower extremity tenderness nor edema.  No joint effusions. Neurologic:  Normal speech and language. No gross focal neurologic deficits are appreciated. Skin:  Skin is warm, dry and intact. No rash noted.  No tenderness to palpation to the midline thoracic spine.  There is no deformity or step-off.  No tenderness to palpation laterally to either side of the thoracic spine. Psychiatric: Mood and affect are normal. Speech and behavior are normal.  ____________________________________________   LABS (all labs ordered are listed, but only abnormal results are displayed)  Labs Reviewed  COMPREHENSIVE METABOLIC PANEL - Abnormal; Notable for the following components:      Result Value   Sodium 134 (*)    Glucose, Bld 107 (*)    Calcium  8.6 (*)    Total Bilirubin 0.2 (*)    All other components within normal limits  PHENYTOIN LEVEL, TOTAL - Abnormal; Notable for the following components:   Phenytoin Lvl <2.5 (*)    All other components within normal limits  URINALYSIS, COMPLETE (UACMP) WITH MICROSCOPIC - Abnormal; Notable for the following components:   Color, Urine COLORLESS (*)    APPearance CLEAR (*)    Specific Gravity, Urine 1.001 (*)    All other components within normal limits  URINE DRUG SCREEN, QUALITATIVE ([facility] ONLY) - Abnormal; Notable for the following components:   Benzodiazepine, Ur Scrn POSITIVE (*)    All other components within normal limits  CBC WITH DIFFERENTIAL/PLATELET  HCG, QUANTITATIVE, PREGNANCY   ____________________________________________  EKG  ____________________________________________  RADIOLOGY  No acute finding in the thoracic nor cervical  spine CAT scans.  CT head without acute finding. ____________________________________________   PROCEDURES  Procedure(s) performed:   Procedures  Critical Care performed:   ____________________________________________   INITIAL IMPRESSION / ASSESSMENT AND PLAN / ED COURSE  Pertinent labs & imaging results that were available during my care of the patient were reviewed by me and considered in my medical decision making (see chart for details).  DDX: Seizure, medication noncompliance, electrolyte abnormality, head injury, thoracic spine contusion, thoracic spine fracture, cervical spine contusion, cervical spine fracture, benzodiazepine withdrawal As part of my medical decision making, I reviewed the following data within the electronic MEDICAL RECORD NUMBER Notes from prior ED visits  Chart reviewed from the recent [facility] visit where she told providers that she had had her last Xanax on May 2.  Her last Xanax prescription was on March 11.  This was from a provider in [place].  I agree with the suspicion about drug-seeking behavior secondary  to this medication being obtained from somewhere remotely.  Patient given a 3-day supply of Librium as well during her visit to [facility] on May 7.  ----------------------------------------- 11:28 PM on 08/04/2017 -----------------------------------------  Patient awake and alert at this time.  Eating food.  No further seizures in the emergency department.  Discussed the case with Dr. Laurence Slate of neurology, who recommends increasing the Dilantin to 3 times a day.  Otherwise, the patient will maintain her normal dose of 600 mg, 3 times daily Neurontin.  Patient says that she has been compliant with her medications.  I do not see signs of withdrawal.  Reassuring vital signs.  No tremor at this time.  Given 1 mg of Ativan and I believe that if the patient was in acute withdrawal it would require much more benzodiazepine dosages in order to subdue withdrawal seizures.  Patient is also not delirious.  She will continue to follow-up with [facility] neurology.  She is understanding of the diagnosis as well as treatment plan and willing to comply. ____________________________________________   FINAL CLINICAL IMPRESSION(S) / ED DIAGNOSES  Seizure.    NEW MEDICATIONS STARTED DURING THIS VISIT:  New Prescriptions   No medications on file     Note:  This document was prepared using Dragon voice recognition software and may include unintentional dictation errors.     Myrna Blazer, MD 08/04/17 2329  Patient now saying that she is out of her Dilantin and gabapentin and has been for 1 month.  This is consistent with the [facility] note which stated that the patient had told multiple providers different stories.      Myrna Blazer, MD 08/04/17 [phone]

## 2017-08-04 NOTE — ED Notes (Signed)
ED Provider at bedside. 

## 2017-08-05 NOTE — ED Notes (Signed)
Reviewed discharge instructions, follow-up care, and prescriptions with patient. Patient verbalized understanding of all information reviewed. Patient stable, with no distress noted at this time.    

## 2019-01-03 ENCOUNTER — Other Ambulatory Visit: Payer: Self-pay

## 2019-01-03 ENCOUNTER — Encounter (HOSPITAL_COMMUNITY): Payer: Self-pay | Admitting: *Deleted

## 2019-01-03 ENCOUNTER — Emergency Department (HOSPITAL_COMMUNITY)
Admission: EM | Admit: 2019-01-03 | Discharge: 2019-01-04 | Disposition: A | Discharge status: Home / Self Care | Payer: Medicare Other | Attending: Emergency Medicine | Admitting: Emergency Medicine

## 2019-01-03 DIAGNOSIS — F1520 Other stimulant dependence, uncomplicated: Secondary | ICD-10-CM | POA: Insufficient documentation

## 2019-01-03 DIAGNOSIS — Z20828 Contact with and (suspected) exposure to other viral communicable diseases: Secondary | ICD-10-CM | POA: Diagnosis not present

## 2019-01-03 DIAGNOSIS — Z79899 Other long term (current) drug therapy: Secondary | ICD-10-CM | POA: Diagnosis not present

## 2019-01-03 DIAGNOSIS — F17210 Nicotine dependence, cigarettes, uncomplicated: Secondary | ICD-10-CM | POA: Insufficient documentation

## 2019-01-03 DIAGNOSIS — F1120 Opioid dependence, uncomplicated: Secondary | ICD-10-CM | POA: Diagnosis not present

## 2019-01-03 DIAGNOSIS — F1410 Cocaine abuse, uncomplicated: Secondary | ICD-10-CM | POA: Insufficient documentation

## 2019-01-03 DIAGNOSIS — F329 Major depressive disorder, single episode, unspecified: Secondary | ICD-10-CM | POA: Insufficient documentation

## 2019-01-03 DIAGNOSIS — F32A Depression, unspecified: Secondary | ICD-10-CM

## 2019-01-03 NOTE — ED Triage Notes (Signed)
Pt reports she is very depressed, wants to talk to someone. Shakes her head "no" when asked about SI. Tearful at times. Says she is not taking her medication. Admits to meth, heroin and etoh use. ETOH use today, unsure of last drug use.

## 2019-01-03 NOTE — ED Notes (Signed)
Attempted lab draw x 2, unable to obtain lab work, pt belongings removed, changed into scrubs, wanded by security. To be transported to [facility]

## 2019-01-03 NOTE — ED Notes (Signed)
ED Provider at bedside. 

## 2019-01-03 NOTE — ED Triage Notes (Signed)
Pt arrives via [facility] with c/o depression and anxiety for 3 weeks. Did take suboxone today. Admits to ETOH use today. 150/88, hr 104, 98% RA, 98.2 temp.

## 2019-01-04 DIAGNOSIS — F329 Major depressive disorder, single episode, unspecified: Secondary | ICD-10-CM | POA: Diagnosis not present

## 2019-01-04 LAB — COMPREHENSIVE METABOLIC PANEL
ALT: 14 U/L (ref 0–44)
AST: 13 U/L — ABNORMAL LOW (ref 15–41)
Albumin: 4 g/dL (ref 3.5–5.0)
Alkaline Phosphatase: 75 U/L (ref 38–126)
Anion gap: 9 (ref 5–15)
BUN: 16 mg/dL (ref 6–20)
CO2: 25 mmol/L (ref 22–32)
Calcium: 8.8 mg/dL — ABNORMAL LOW (ref 8.9–10.3)
Chloride: 101 mmol/L (ref 98–111)
Creatinine, Ser: 0.68 mg/dL (ref 0.44–1.00)
GFR calc Af Amer: >60 mL/min (ref >60)
GFR calc non Af Amer: >60 mL/min (ref >60)
Glucose, Bld: 103 mg/dL — ABNORMAL HIGH (ref 70–99)
Potassium: 3.4 mmol/L — ABNORMAL LOW (ref 3.5–5.1)
Sodium: 135 mmol/L (ref 135–145)
Total Bilirubin: 0.6 mg/dL (ref 0.3–1.2)
Total Protein: 7.1 g/dL (ref 6.5–8.1)

## 2019-01-04 LAB — SARS CORONAVIRUS 2 BY RT PCR (HOSPITAL ORDER, PERFORMED IN ~~LOC~~ HOSPITAL LAB): SARS Coronavirus 2: NEGATIVE

## 2019-01-04 LAB — CBC
HCT: 40.5 % (ref 36.0–46.0)
Hemoglobin: 13.4 g/dL (ref 12.0–15.0)
MCH: 26.7 pg (ref 26.0–34.0)
MCHC: 33.1 g/dL (ref 30.0–36.0)
MCV: 80.7 fL (ref 80.0–100.0)
Platelets: 406 10*3/uL — ABNORMAL HIGH (ref 150–400)
RBC: 5.02 MIL/uL (ref 3.87–5.11)
RDW: 13.3 % (ref 11.5–15.5)
WBC: 10 10*3/uL (ref 4.0–10.5)
nRBC: 0 % (ref 0.0–0.2)

## 2019-01-04 LAB — RAPID URINE DRUG SCREEN, HOSP PERFORMED
Amphetamines: POSITIVE — AB
Barbiturates: NOT DETECTED
Benzodiazepines: NOT DETECTED
Cocaine: NOT DETECTED
Opiates: NOT DETECTED
Tetrahydrocannabinol: NOT DETECTED

## 2019-01-04 LAB — ETHANOL: Alcohol, Ethyl (B): <10 mg/dL (ref <10)

## 2019-01-04 LAB — PREGNANCY, URINE: Preg Test, Ur: NEGATIVE

## 2019-01-04 NOTE — ED Notes (Signed)
Calm and cooperative on admission to Acute Unit. Oriented to unit and 15 min checks initiated.
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
## 2019-01-04 NOTE — BHH Suicide Risk Assessment (Cosign Needed)
Suicide Risk Assessment  Discharge Assessment   [facility] Discharge Suicide Risk Assessment   Principal Problem: Substance Induced Mood Disorder Discharge Diagnoses: Substance Induced Mood DIsorder  Total Time spent with patient: 20 minutes  Patient observed overnight, appropriate, oriented and cooperative for assessment. Patient states "I used meth the night before last." Patient denies desire for substance use treatment.  Patient denies thoughts of suicide, denies homicidal ideations. Patient denies both auditory and visual hallucinations.   Patient lives with her aunt, plans to discharge to aunt's home.   Musculoskeletal: Strength & Muscle Tone: within normal limits Gait & Station: normal Patient leans: N/A  Psychiatric Specialty Exam:   Blood pressure 104/63, pulse 72, temperature 98.4 F (36.9 C), temperature source Oral, resp. rate 16, height 5\' 8"  (1.727 m), weight 65.8 kg, SpO2 96 %.Body mass index is 22.05 kg/m.  General Appearance: Casual  Eye Contact::  Fair  Speech:  Clear and Coherent  Volume:  Normal  Mood:  Depressed  Affect:  Depressed  Thought Process:  Coherent and Descriptions of Associations: Intact  Orientation:  Full (Time, Place, and Person)  Thought Content:  Logical  Suicidal Thoughts:  No  Homicidal Thoughts:  No  Memory:  Immediate;   Good Recent;   Good Remote;   Good  Judgement:  Good  Insight:  Fair  Psychomotor Activity:  Normal  Concentration:  Good  Recall:  Good  Fund of Knowledge:Good  Language: Good  Akathisia:  NA  Handed:  Right  AIMS (if indicated):     Assets:  Communication Skills Housing Social Support  Sleep:     Cognition: WNL  ADL's:  Intact   Mental Status Per Nursing Assessment::   On Admission:     Demographic Factors:  Caucasian  Loss Factors: Legal issues  Historical Factors: NA  Risk Reduction Factors:   Living with another person, especially a relative, Positive social support and Positive therapeutic  relationship  Continued Clinical Symptoms:  Alcohol/Substance Abuse/Dependencies  Cognitive Features That Contribute To Risk:  None    Suicide Risk:  Minimal: No identifiable suicidal ideation.  Patients presenting with no risk factors but with morbid ruminations; may be classified as minimal risk based on the severity of the depressive symptoms    Plan Of Care/Follow-up recommendations:  Other:  Patient plans to discharge to Aunt's home.  Emmaline Kluver, FNP 01/04/2019, 10:45 AM

## 2019-01-04 NOTE — ED Notes (Signed)
Pt discharged home. Discharged instructions read to pt who verbalized understanding. All belongings returned to pt who signed for same. Denies SI/HI, is not delusional and not responding to internal stimuli. Escorted pt to the ED exit.    

## 2019-01-04 NOTE — ED Provider Notes (Signed)
[facility]-EMERGENCY DEPT Provider Note   CSN: [id number] Arrival date & time: 01/03/19  2257     History   Chief Complaint Chief Complaint  Patient presents with  . Depression    HPI Megan Shaffer is a 48 y.o. female.     Patient presents to the emergency department with a chief complaint of depression.  She states she has been depressed since March, when she states that she for the loss of her child.  She denies any SI or HI.  She reports she has been using alcohol to cope.  She denies any other drug use.  She is reluctant to give further history.  Level 5 caveat applies secondary to psychiatric condition.  The history is provided by the patient. No language interpreter was used.    Past Medical History:  Diagnosis Date  . Hepatitis C   . Seizures (HCC)     Patient Active Problem List   Diagnosis Date Noted  . Bipolar 2 disorder, major depressive episode (HCC) 11/29/2015  . Opioid use disorder, moderate, dependence (HCC) 11/29/2015  . Alcohol use disorder, moderate, dependence (HCC) 11/28/2015  . Seizure disorder (HCC) 11/28/2015  . Cocaine use disorder, moderate, dependence (HCC) 11/28/2015  . Substance induced mood disorder (HCC) 11/28/2015  . Tobacco use disorder 11/28/2015    Past Surgical History:  Procedure Laterality Date  . ABDOMINAL SURGERY    . CHOLECYSTECTOMY    . OOPHORECTOMY    . OVARIAN CYST SURGERY    . TONSILLECTOMY    . TUBAL LIGATION       OB History   No obstetric history on file.      Home Medications    Prior to Admission medications   Medication Sig Start Date End Date Taking? Authorizing Provider  doxycycline (VIBRAMYCIN) 100 MG capsule Take 1 capsule (100 mg total) by mouth 2 (two) times daily. 07/25/16   Phineas Semen, MD  furosemide (LASIX) 20 MG tablet Take 1 tablet (20 mg total) by mouth daily as needed for fluid or edema. 12/05/15   Pucilowska, Braulio Conte B, MD  gabapentin (NEURONTIN) 300 MG capsule Take  2 capsules (600 mg total) by mouth 3 (three) times daily. 08/04/17 08/04/18  Myrna Blazer, MD  pantoprazole (PROTONIX) 40 MG tablet Take 1 tablet (40 mg total) by mouth daily. 12/04/15   Pucilowska, Braulio Conte B, MD  phenytoin (DILANTIN) 100 MG ER capsule Take 1 capsule (100 mg total) by mouth 3 (three) times daily. 08/04/17 08/04/18  Myrna Blazer, MD  QUEtiapine (SEROQUEL) 300 MG tablet Take 1 tablet (300 mg total) by mouth at bedtime. 12/04/15   Pucilowska, Braulio Conte B, MD  traZODone (DESYREL) 100 MG tablet Take 1 tablet (100 mg total) by mouth at bedtime as needed for sleep. 12/04/15   PucilowskaEllin Goodie, MD    Family History Family History  Problem Relation Age of Onset  . Cancer Mother   . Healthy Father     Social History Social History   Tobacco Use  . Smoking status: Current Every Day Smoker    Packs/day: 2.00    Types: Cigarettes  . Smokeless tobacco: Never Used  Substance Use Topics  . Alcohol use: Yes    Comment: Fifth of liquor daily   . Drug use: Yes    Types: IV, Cocaine, Heroin    Comment: also Heroin     Allergies   Ciprofloxacin, Geodon [ziprasidone hcl], Toradol [ketorolac tromethamine], Famotidine, Imitrex [sumatriptan], and Zofran [ondansetron hcl]  Review of Systems Review of Systems  Unable to perform ROS: Psychiatric disorder     Physical Exam Updated Vital Signs BP (!) 138/101 (BP Location: Right Arm)   Pulse 91   Temp 98.1 F (36.7 C) (Oral)   Resp 15   Ht 5\' 8"  (1.727 m)   Wt 65.8 kg   SpO2 99%   BMI 22.05 kg/m   Physical Exam Vitals signs and nursing note reviewed.  Constitutional:      General: She is not in acute distress.    Appearance: She is well-developed.  HENT:     Head: Normocephalic and atraumatic.  Eyes:     Conjunctiva/sclera: Conjunctivae normal.  Neck:     Musculoskeletal: Neck supple.  Cardiovascular:     Rate and Rhythm: Normal rate and regular rhythm.     Heart sounds: No murmur.   Pulmonary:     Effort: Pulmonary effort is normal. No respiratory distress.     Breath sounds: Normal breath sounds.  Abdominal:     Palpations: Abdomen is soft.     Tenderness: There is no abdominal tenderness.  Musculoskeletal: Normal range of motion.  Skin:    General: Skin is warm and dry.  Neurological:     Mental Status: She is alert and oriented to person, place, and time.  Psychiatric:     Comments: Withdrawn      ED Treatments / Results  Labs (all labs ordered are listed, but only abnormal results are displayed) Labs Reviewed  COMPREHENSIVE METABOLIC PANEL  ETHANOL  CBC  RAPID URINE DRUG SCREEN, HOSP PERFORMED  I-STAT BETA HCG BLOOD, ED ([facility], [facility], [facility] ONLY)    EKG None  Radiology No results found.  Procedures Procedures (including critical care time)  Medications Ordered in ED Medications - No data to display   Initial Impression / Assessment and Plan / ED Course  I have reviewed the triage vital signs and the nursing notes.  Pertinent labs & imaging results that were available during my care of the patient were reviewed by me and considered in my medical decision making (see chart for details).        Patient here with depression.  States that she is depressed because of the loss of 1 of her children.  Denies SI or HI.  History of polysubstance abuse.  TTS recommends inpatient.  Final Clinical Impressions(s) / ED Diagnoses   Final diagnoses:  Depression, unspecified depression type    ED Discharge Orders    None       Montine Circle, PA-C 01/04/19 0505    Fatima Blank, MD 01/07/19 2106

## 2019-01-04 NOTE — ED Notes (Signed)
Pt declined to answer nurse's questions, she is laying in bed, quietly crying, avoiding eye contact.

## 2019-01-04 NOTE — ED Notes (Signed)
Two unsuccessful attempt to obtain blood, hospital phlebotomy called.

## 2019-01-04 NOTE — ED Notes (Signed)
Pt belongings includes: various cards, clothes, flipflops, and cellphone. Placed in locker 34

## 2019-04-23 ENCOUNTER — Emergency Department (HOSPITAL_COMMUNITY): Payer: Medicare Other

## 2019-04-23 ENCOUNTER — Other Ambulatory Visit: Payer: Self-pay

## 2019-04-23 ENCOUNTER — Emergency Department (HOSPITAL_COMMUNITY)
Admission: EM | Admit: 2019-04-23 | Discharge: 2019-04-23 | Disposition: A | Discharge status: Home / Self Care | Payer: Medicare Other | Attending: Emergency Medicine | Admitting: Emergency Medicine

## 2019-04-23 DIAGNOSIS — F17210 Nicotine dependence, cigarettes, uncomplicated: Secondary | ICD-10-CM | POA: Insufficient documentation

## 2019-04-23 DIAGNOSIS — R5381 Other malaise: Secondary | ICD-10-CM | POA: Insufficient documentation

## 2019-04-23 DIAGNOSIS — R600 Localized edema: Secondary | ICD-10-CM | POA: Insufficient documentation

## 2019-04-23 DIAGNOSIS — R569 Unspecified convulsions: Secondary | ICD-10-CM | POA: Insufficient documentation

## 2019-04-23 DIAGNOSIS — R079 Chest pain, unspecified: Secondary | ICD-10-CM | POA: Diagnosis present

## 2019-04-23 DIAGNOSIS — Z79899 Other long term (current) drug therapy: Secondary | ICD-10-CM | POA: Insufficient documentation

## 2019-04-23 DIAGNOSIS — R609 Edema, unspecified: Secondary | ICD-10-CM

## 2019-04-23 LAB — I-STAT BETA HCG BLOOD, ED (MC, WL, AP ONLY): I-stat hCG, quantitative: <5 m[IU]/mL (ref <5)

## 2019-04-23 LAB — BASIC METABOLIC PANEL
Anion gap: 6 (ref 5–15)
BUN: 5 mg/dL — ABNORMAL LOW (ref 6–20)
CO2: 26 mmol/L (ref 22–32)
Calcium: 8.2 mg/dL — ABNORMAL LOW (ref 8.9–10.3)
Chloride: 103 mmol/L (ref 98–111)
Creatinine, Ser: 0.63 mg/dL (ref 0.44–1.00)
GFR calc Af Amer: >60 mL/min (ref >60)
GFR calc non Af Amer: >60 mL/min (ref >60)
Glucose, Bld: 102 mg/dL — ABNORMAL HIGH (ref 70–99)
Potassium: 3.9 mmol/L (ref 3.5–5.1)
Sodium: 135 mmol/L (ref 135–145)

## 2019-04-23 LAB — CBC
HCT: 35.8 % — ABNORMAL LOW (ref 36.0–46.0)
Hemoglobin: 11.4 g/dL — ABNORMAL LOW (ref 12.0–15.0)
MCH: 26 pg (ref 26.0–34.0)
MCHC: 31.8 g/dL (ref 30.0–36.0)
MCV: 81.7 fL (ref 80.0–100.0)
Platelets: 289 10*3/uL (ref 150–400)
RBC: 4.38 MIL/uL (ref 3.87–5.11)
RDW: 13.4 % (ref 11.5–15.5)
WBC: 8.1 10*3/uL (ref 4.0–10.5)
nRBC: 0 % (ref 0.0–0.2)

## 2019-04-23 LAB — TROPONIN I (HIGH SENSITIVITY): Troponin I (High Sensitivity): <2 ng/L (ref <18)

## 2019-04-23 MED ORDER — SODIUM CHLORIDE 0.9% FLUSH: 3.0000 mL | Freq: Once | INTRAVENOUS | Status: DC

## 2019-04-23 NOTE — ED Notes (Signed)
Pt discharge education provided. Pt provided with information on shelters and other resources. Pt verbalizes understanding. Pt is alert and oriented x4 and ambulatory at discharge.

## 2019-04-23 NOTE — ED Notes (Signed)
Pt transported to radiology.

## 2019-04-23 NOTE — Discharge Instructions (Addendum)
The testing today did not show any serious problems like heart failure.  Your swelling will likely improve by increasing the protein in your diet, getting some daily exercise, and resting when needed.  Follow-up with the doctor of your choice as needed for problems.

## 2019-04-23 NOTE — ED Triage Notes (Signed)
Pt presents to the ED by EMS for chest heaviness with bilateral lower leg pitting edema. Pt has weeping from her hands. Pt complains of generalized pain. Pt reports that she has spent the past two nights sleeping/living outside without adequate cold weather clothing or protection. Pt reports that she received phenegran IV about 1-2 weeks ago to her right [facility] and reports that it infliltrated. Area is red and scabbed over.

## 2019-04-23 NOTE — ED Provider Notes (Signed)
[facility] EMERGENCY DEPARTMENT Provider Note   CSN: [id number] Arrival date & time: 04/23/19  1638     History Chief Complaint  Patient presents with  . Chest Pain    Megan Shaffer is a 49 y.o. female.  HPI Patient presents for evaluation of swelling in the legs and a heavy sensation in her chest.  She is homeless and has been sleeping outside.  She is concerned about pain and swelling in her right arm, where she states she recently had an injection through an IV, Reglan.  She was evaluated for headache on 03/20/2019 and given a migraine cocktail with Benadryl, and Reglan.  She complains of generalized swelling, arms and legs and an injury to her left lower leg causing swelling just above her ankle, medial aspect.  No shortness of breath, fever, chills, cough, weakness or dizziness.  She states that she had a history of congestive heart failure, when she was using heroin.  She states she is currently not using heroin.  There are no other known modifying factors.    Past Medical History:  Diagnosis Date  . Hepatitis C   . Seizures (HCC)     Patient Active Problem List   Diagnosis Date Noted  . Bipolar 2 disorder, major depressive episode (HCC) 11/29/2015  . Opioid use disorder, moderate, dependence (HCC) 11/29/2015  . Alcohol use disorder, moderate, dependence (HCC) 11/28/2015  . Seizure disorder (HCC) 11/28/2015  . Cocaine use disorder, moderate, dependence (HCC) 11/28/2015  . Substance induced mood disorder (HCC) 11/28/2015  . Tobacco use disorder 11/28/2015    Past Surgical History:  Procedure Laterality Date  . ABDOMINAL SURGERY    . CHOLECYSTECTOMY    . OOPHORECTOMY    . OVARIAN CYST SURGERY    . TONSILLECTOMY    . TUBAL LIGATION       OB History   No obstetric history on file.     Family History  Problem Relation Age of Onset  . Cancer Mother   . Healthy Father     Social History   Tobacco Use  . Smoking status: Current Every Day  Smoker    Packs/day: 2.00    Types: Cigarettes  . Smokeless tobacco: Never Used  Substance Use Topics  . Alcohol use: Yes    Comment: Fifth of liquor daily   . Drug use: Yes    Types: IV, Cocaine, Heroin    Comment: also Heroin    Home Medications Prior to Admission medications   Medication Sig Start Date End Date Taking? Authorizing Provider  furosemide (LASIX) 20 MG tablet Take 1 tablet (20 mg total) by mouth daily as needed for fluid or edema. Patient not taking: Reported on 01/04/2019 12/05/15   Pucilowska, Braulio Conte B, MD  gabapentin (NEURONTIN) 300 MG capsule Take 2 capsules (600 mg total) by mouth 3 (three) times daily. Patient not taking: Reported on 01/04/2019 08/04/17 08/04/18  Myrna Blazer, MD  pantoprazole (PROTONIX) 40 MG tablet Take 1 tablet (40 mg total) by mouth daily. Patient not taking: Reported on 01/04/2019 12/04/15   Pucilowska, Ellin Goodie, MD  phenytoin (DILANTIN) 100 MG ER capsule Take 1 capsule (100 mg total) by mouth 3 (three) times daily. 08/04/17 08/04/18  Myrna Blazer, MD  QUEtiapine (SEROQUEL) 300 MG tablet Take 1 tablet (300 mg total) by mouth at bedtime. 12/04/15   Pucilowska, Braulio Conte B, MD  traZODone (DESYREL) 100 MG tablet Take 1 tablet (100 mg total) by mouth at bedtime as needed  for sleep. 12/04/15   Pucilowska, Ellin Goodie, MD    Allergies    Ciprofloxacin, Geodon [ziprasidone hcl], Toradol [ketorolac tromethamine], Famotidine, Imitrex [sumatriptan], and Zofran [ondansetron hcl]  Review of Systems   Review of Systems  All other systems reviewed and are negative.   Physical Exam Updated Vital Signs BP 129/72   Pulse 67   Temp 98 F (36.7 C) (Oral)   Resp (!) 22   Ht 5\' 8"  (1.727 m)   Wt 65.8 kg   SpO2 97%   BMI 22.06 kg/m   Physical Exam Vitals and nursing note reviewed.  Constitutional:      General: She is not in acute distress.    Appearance: She is well-developed. She is not ill-appearing, toxic-appearing or  diaphoretic.  HENT:     Head: Normocephalic and atraumatic.     Right Ear: External ear normal.     Left Ear: External ear normal.  Eyes:     Conjunctiva/sclera: Conjunctivae normal.     Pupils: Pupils are equal, round, and reactive to light.  Neck:     Trachea: Phonation normal.  Cardiovascular:     Rate and Rhythm: Normal rate and regular rhythm.     Heart sounds: Normal heart sounds.     Comments: Well-perfused hands and feet bilaterally without evidence for frostbite. Pulmonary:     Effort: Pulmonary effort is normal.     Breath sounds: Normal breath sounds.  Abdominal:     Palpations: Abdomen is soft.     Tenderness: There is no abdominal tenderness.  Musculoskeletal:        General: Normal range of motion.     Cervical back: Normal range of motion and neck supple.     Comments: Mild generalized swelling, arms and legs bilaterally.  No asymmetry of extremities.  Mild tenderness with vague swelling, left medial ankle region, extending up about 10 cm.  No left calf deformity or tenderness.  Skin:    General: Skin is warm and dry.     Comments: No rash or skin lesions appreciated.  Right antecubital fossa with healing puncture wound without signs of deep tissue infection, or traumatic injury.  Neurological:     Mental Status: She is alert and oriented to person, place, and time.     Cranial Nerves: No cranial nerve deficit.     Sensory: No sensory deficit.     Motor: No abnormal muscle tone.     Coordination: Coordination normal.  Psychiatric:        Mood and Affect: Mood normal.        Behavior: Behavior normal.        Thought Content: Thought content normal.        Judgment: Judgment normal.     ED Results / Procedures / Treatments   Labs (all labs ordered are listed, but only abnormal results are displayed) Labs Reviewed  BASIC METABOLIC PANEL - Abnormal; Notable for the following components:      Result Value   Glucose, Bld 102 (*)    BUN 5 (*)    Calcium 8.2  (*)    All other components within normal limits  CBC - Abnormal; Notable for the following components:   Hemoglobin 11.4 (*)    HCT 35.8 (*)    All other components within normal limits  I-STAT BETA HCG BLOOD, ED ([facility], [facility], [facility] ONLY)  TROPONIN I (HIGH SENSITIVITY)  TROPONIN I (HIGH SENSITIVITY)    EKG EKG Interpretation  Date/Time:  Thursday April 23 2019 16:50:59 EST Ventricular Rate:  71 PR Interval:    QRS Duration: 79 QT Interval:  398 QTC Calculation: 433 R Axis:   35 Text Interpretation: Sinus rhythm since last tracing no significant change Confirmed by Daleen Bo [phone]) on 04/23/2019 5:26:08 PM   Radiology DG Chest 2 View  Result Date: 04/23/2019 CLINICAL DATA:  Chest pressure, smoking history EXAM: CHEST - 2 VIEW COMPARISON:  12/04/2015 FINDINGS: The heart size and mediastinal contours are within normal limits. Streaky opacity within the lingula, not significantly changed from prior study, likely representing an area of scarring or atelectasis. No pleural effusion or pneumothorax. The visualized skeletal structures are unremarkable. IMPRESSION: 1. No active cardiopulmonary disease. 2. Chronic lingular scarring or atelectasis. Electronically Signed   By: Davina Poke D.O.   On: 04/23/2019 18:47    Procedures Procedures (including critical care time)  Medications Ordered in ED Medications  sodium chloride flush (NS) 0.9 % injection 3 mL (has no administration in time range)    ED Course  I have reviewed the triage vital signs and the nursing notes.  Pertinent labs & imaging results that were available during my care of the patient were reviewed by me and considered in my medical decision making (see chart for details).  Clinical Course as of Apr 22 2046  Thu Apr 23, 2019  1646 Normal  I-Stat beta hCG blood, ED [EW]  1819 Normal  Troponin I (High Sensitivity) [EW]  1819 Normal except glucose high, BUN low, calcium low  Basic metabolic panel(!) [EW]    2043 Normal except hemoglobin low  CBC(!) [EW]  2044 No infiltrate or CHF, interpreted by me  DG Chest 2 View [EW]    Clinical Course User Index [EW] Daleen Bo, MD   MDM Rules/Calculators/A&P                       Patient Vitals for the past 24 hrs:  BP Temp Temp src Pulse Resp SpO2 Height Weight  04/23/19 1815 129/72 -- -- -- (!) 22 -- -- --  04/23/19 1730 122/74 -- -- 67 14 97 % -- --  04/23/19 1649 -- -- -- -- -- -- 5\' 8"  (1.727 m) 65.8 kg  04/23/19 1648 132/75 98 F (36.7 C) Oral 77 (!) 24 94 % -- --  04/23/19 1647 -- -- -- -- -- 98 % -- --    8:44 PM Reevaluation with update and discussion. After initial assessment and treatment, an updated evaluation reveals she is comfortable, has eaten and wants another meal.  Findings discussed and questions answered. Daleen Bo   Medical Decision Making: Nonspecific swelling, and malaise, and patient who is homeless.  Screening evaluation is reassuring.  No evidence for congestive heart failure, ACS, metabolic instability or serious bacterial infection.  No indication for hospitalization at this time.  HEBE MERRIWETHER was evaluated in Emergency Department on 04/23/2019 for the symptoms described in the history of present illness. She was evaluated in the context of the global COVID-19 pandemic, which necessitated consideration that the patient might be at risk for infection with the SARS-CoV-2 virus that causes COVID-19. Institutional protocols and algorithms that pertain to the evaluation of patients at risk for COVID-19 are in a state of rapid change based on information released by regulatory bodies including the CDC and federal and state organizations. These policies and algorithms were followed during the patient's care in the ED.   CRITICAL CARE-no Performed by: Daleen Bo  Nursing Notes Reviewed/ Care Coordinated Applicable Imaging Reviewed Interpretation of Laboratory Data incorporated into ED treatment  The patient  appears reasonably screened and/or stabilized for discharge and I doubt any other medical condition or other [facility] requiring further screening, evaluation, or treatment in the ED at this time prior to discharge.  Plan: Home Medications-no change; Home Treatments-increase protein in diet to help edema; return here if the recommended treatment, does not improve the symptoms; Recommended follow up-PCP, as needed    Final Clinical Impression(s) / ED Diagnoses Final diagnoses:  Malaise  Peripheral edema    Rx / DC Orders ED Discharge Orders    None       Mancel Bale, MD 04/23/19 2049

## 2019-04-24 ENCOUNTER — Encounter (HOSPITAL_COMMUNITY): Payer: Self-pay | Admitting: *Deleted

## 2019-04-24 ENCOUNTER — Other Ambulatory Visit: Payer: Self-pay

## 2019-04-24 ENCOUNTER — Emergency Department (HOSPITAL_COMMUNITY)
Admission: EM | Admit: 2019-04-24 | Discharge: 2019-04-24 | Disposition: A | Discharge status: Home / Self Care | Payer: Medicare Other | Attending: Emergency Medicine | Admitting: Emergency Medicine

## 2019-04-24 DIAGNOSIS — Z79899 Other long term (current) drug therapy: Secondary | ICD-10-CM | POA: Diagnosis not present

## 2019-04-24 DIAGNOSIS — Z5900 Homelessness unspecified: Secondary | ICD-10-CM

## 2019-04-24 DIAGNOSIS — Z20822 Contact with and (suspected) exposure to covid-19: Secondary | ICD-10-CM | POA: Diagnosis not present

## 2019-04-24 DIAGNOSIS — F17210 Nicotine dependence, cigarettes, uncomplicated: Secondary | ICD-10-CM | POA: Diagnosis not present

## 2019-04-24 LAB — POC SARS CORONAVIRUS 2 AG -  ED: SARS Coronavirus 2 Ag: NEGATIVE

## 2019-04-24 NOTE — Discharge Instructions (Signed)
We have secured a shelter for you.  Please follow instruction provided.

## 2019-04-24 NOTE — ED Notes (Signed)
SW provided pt with food and drink
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
## 2019-04-24 NOTE — ED Triage Notes (Signed)
Pt brought in by ems.  She was at a gas station where she was having a panic attack and [employer] called for ems.  Pt was tachypneic, told ems that she doesn't have anywhere to stay until 2/1.  She told ems that she has been out of her psych meds and has been hearing whispering voices.  No SI or HI. Pt was on xanax for 20 years and last month her MD stopped this as well as her seroquel. Pt is going to suboxone clinic daily. VSS (132/84, 104, 20, 98% ra, 52F) pt alert and oriented

## 2019-04-24 NOTE — ED Notes (Signed)
Pt provided with phone in order to speak to SW

## 2019-04-24 NOTE — Progress Notes (Signed)
CSW received TOC consult stating patient is homeless and needs assistance. Per notes, patient is to move into an apartment on Monday, but needs a place to stay until then. CSW spoke with [facility] in [facility] who reports they have beds available, but patient needs COVID test. Patient was agreeable to go to [facility] and to a COVID test. CSW to assist patient with transportation.   Geralyn Corwin, LCSW Transitions of Care Department [facility] ED [phone]

## 2019-04-24 NOTE — ED Notes (Signed)
Pt resting in bed. NAD noted. Pt appears to be sleeping. Equal rise and fall of chest noted.

## 2019-05-21 MED ORDER — NICOTINE 14 MG/24HR TD PT24
1.00 | MEDICATED_PATCH | TRANSDERMAL | Status: DC
Start: 2019-05-22 — End: 2019-05-21

## 2019-05-21 MED ORDER — BUPRENORPHINE HCL-NALOXONE HCL 8-2 MG SL SUBL
1.00 | SUBLINGUAL_TABLET | SUBLINGUAL | Status: DC
Start: 2019-05-21 — End: 2019-05-21

## 2019-05-21 MED ORDER — HYDRALAZINE HCL 20 MG/ML IJ SOLN
10.00 | INTRAMUSCULAR | Status: DC
Start: ? — End: 2019-05-21

## 2019-05-21 MED ORDER — GABAPENTIN 400 MG PO CAPS
1600.00 | ORAL_CAPSULE | ORAL | Status: DC
Start: 2019-05-21 — End: 2019-05-21

## 2019-05-21 MED ORDER — LACTATED RINGERS IV SOLN
INTRAVENOUS | Status: DC
Start: ? — End: 2019-05-21

## 2019-05-21 MED ORDER — PNEUMOCOCCAL VAC POLYVALENT 25 MCG/0.5ML IJ INJ
0.50 | INJECTION | INTRAMUSCULAR | Status: DC
Start: ? — End: 2019-05-21

## 2019-05-21 MED ORDER — PHENYTOIN SODIUM EXTENDED 100 MG PO CAPS
100.00 | ORAL_CAPSULE | ORAL | Status: DC
Start: 2019-05-21 — End: 2019-05-21

## 2019-05-21 MED ORDER — INFLUENZA VAC SPLIT QUAD 0.5 ML IM SUSY
0.50 | PREFILLED_SYRINGE | INTRAMUSCULAR | Status: DC
Start: ? — End: 2019-05-21

## 2019-05-21 MED ORDER — FLUCONAZOLE 50 MG PO TABS
150.00 | ORAL_TABLET | ORAL | Status: DC
Start: 2019-05-22 — End: 2019-05-21

## 2019-05-21 MED ORDER — PROCHLORPERAZINE EDISYLATE 10 MG/2ML IJ SOLN
10.00 | INTRAMUSCULAR | Status: DC
Start: ? — End: 2019-05-21

## 2019-05-21 MED ORDER — DIPHENHYDRAMINE HCL 25 MG PO CAPS
25.00 | ORAL_CAPSULE | ORAL | Status: DC
Start: ? — End: 2019-05-21

## 2020-02-22 ENCOUNTER — Emergency Department (HOSPITAL_COMMUNITY): Payer: Medicare Other

## 2020-02-22 ENCOUNTER — Ambulatory Visit (HOSPITAL_COMMUNITY)
Admission: EM | Admit: 2020-02-22 | Discharge: 2020-02-22 | Disposition: A | Discharge status: Home / Self Care | Payer: No Typology Code available for payment source | Attending: Emergency Medicine | Admitting: Emergency Medicine

## 2020-02-22 ENCOUNTER — Emergency Department (HOSPITAL_COMMUNITY)
Admission: EM | Admit: 2020-02-22 | Discharge: 2020-02-22 | Disposition: A | Discharge status: Home / Self Care | Payer: Medicare Other | Attending: Emergency Medicine | Admitting: Emergency Medicine

## 2020-02-22 DIAGNOSIS — N939 Abnormal uterine and vaginal bleeding, unspecified: Secondary | ICD-10-CM | POA: Diagnosis present

## 2020-02-22 DIAGNOSIS — S7002XA Contusion of left hip, initial encounter: Secondary | ICD-10-CM | POA: Insufficient documentation

## 2020-02-22 DIAGNOSIS — Z0441 Encounter for examination and observation following alleged adult rape: Secondary | ICD-10-CM | POA: Insufficient documentation

## 2020-02-22 DIAGNOSIS — R1084 Generalized abdominal pain: Secondary | ICD-10-CM | POA: Insufficient documentation

## 2020-02-22 DIAGNOSIS — Y92039 Unspecified place in apartment as the place of occurrence of the external cause: Secondary | ICD-10-CM | POA: Diagnosis not present

## 2020-02-22 DIAGNOSIS — R102 Pelvic and perineal pain: Secondary | ICD-10-CM | POA: Diagnosis not present

## 2020-02-22 DIAGNOSIS — R519 Headache, unspecified: Secondary | ICD-10-CM | POA: Insufficient documentation

## 2020-02-22 DIAGNOSIS — T7421XA Adult sexual abuse, confirmed, initial encounter: Secondary | ICD-10-CM | POA: Diagnosis not present

## 2020-02-22 LAB — URINALYSIS, ROUTINE W REFLEX MICROSCOPIC
Bilirubin Urine: NEGATIVE
Glucose, UA: NEGATIVE mg/dL
Ketones, ur: NEGATIVE mg/dL
Nitrite: NEGATIVE
Protein, ur: 100 mg/dL — AB
RBC / HPF: >50 RBC/hpf — ABNORMAL HIGH (ref 0–5)
Specific Gravity, Urine: 1.008 (ref 1.005–1.030)
pH: 6 (ref 5.0–8.0)

## 2020-02-22 LAB — CBC WITH DIFFERENTIAL/PLATELET
Abs Immature Granulocytes: 0.04 10*3/uL (ref 0.00–0.07)
Basophils Absolute: 0.2 10*3/uL — ABNORMAL HIGH (ref 0.0–0.1)
Basophils Relative: 1 %
Eosinophils Absolute: 0.3 10*3/uL (ref 0.0–0.5)
Eosinophils Relative: 2 %
HCT: 39.2 % (ref 36.0–46.0)
Hemoglobin: 13.1 g/dL (ref 12.0–15.0)
Immature Granulocytes: 0 %
Lymphocytes Relative: 26 %
Lymphs Abs: 3.4 10*3/uL (ref 0.7–4.0)
MCH: 27.2 pg (ref 26.0–34.0)
MCHC: 33.4 g/dL (ref 30.0–36.0)
MCV: 81.5 fL (ref 80.0–100.0)
Monocytes Absolute: 1 10*3/uL (ref 0.1–1.0)
Monocytes Relative: 8 %
Neutro Abs: 7.9 10*3/uL — ABNORMAL HIGH (ref 1.7–7.7)
Neutrophils Relative %: 63 %
Platelets: 415 10*3/uL — ABNORMAL HIGH (ref 150–400)
RBC: 4.81 MIL/uL (ref 3.87–5.11)
RDW: 12.7 % (ref 11.5–15.5)
WBC: 12.8 10*3/uL — ABNORMAL HIGH (ref 4.0–10.5)
nRBC: 0 % (ref 0.0–0.2)

## 2020-02-22 LAB — COMPREHENSIVE METABOLIC PANEL
ALT: 13 U/L (ref 0–44)
AST: 17 U/L (ref 15–41)
Albumin: 3.5 g/dL (ref 3.5–5.0)
Alkaline Phosphatase: 81 U/L (ref 38–126)
Anion gap: 11 (ref 5–15)
BUN: 9 mg/dL (ref 6–20)
CO2: 23 mmol/L (ref 22–32)
Calcium: 8.6 mg/dL — ABNORMAL LOW (ref 8.9–10.3)
Chloride: 103 mmol/L (ref 98–111)
Creatinine, Ser: 0.77 mg/dL (ref 0.44–1.00)
GFR, Estimated: >60 mL/min (ref >60)
Glucose, Bld: 95 mg/dL (ref 70–99)
Potassium: 3.6 mmol/L (ref 3.5–5.1)
Sodium: 137 mmol/L (ref 135–145)
Total Bilirubin: 0.9 mg/dL (ref 0.3–1.2)
Total Protein: 6.5 g/dL (ref 6.5–8.1)

## 2020-02-22 LAB — RAPID URINE DRUG SCREEN, HOSP PERFORMED
Amphetamines: POSITIVE — AB
Barbiturates: NOT DETECTED
Benzodiazepines: NOT DETECTED
Cocaine: NOT DETECTED
Opiates: POSITIVE — AB
Tetrahydrocannabinol: NOT DETECTED

## 2020-02-22 LAB — I-STAT BETA HCG BLOOD, ED (MC, WL, AP ONLY): I-stat hCG, quantitative: <5 m[IU]/mL (ref <5)

## 2020-02-22 MED ORDER — LIDOCAINE HCL (PF) 1 % IJ SOLN
1.0000 mL | Freq: Once | INTRAMUSCULAR | Status: AC
  Administered 2020-02-22: 1 mL
  Filled 2020-02-22: qty 5

## 2020-02-22 MED ORDER — AZITHROMYCIN 250 MG PO TABS
1000.0000 mg | ORAL_TABLET | Freq: Once | ORAL | Status: AC
  Administered 2020-02-22: 1000 mg via ORAL
  Filled 2020-02-22: qty 4

## 2020-02-22 MED ORDER — MORPHINE SULFATE (PF) 4 MG/ML IV SOLN
4.0000 mg | Freq: Once | INTRAVENOUS | Status: AC
  Administered 2020-02-22: 4 mg via INTRAVENOUS
  Filled 2020-02-22: qty 1

## 2020-02-22 MED ORDER — IOHEXOL 300 MG/ML  SOLN
100.0000 mL | Freq: Once | INTRAMUSCULAR | Status: AC | PRN
  Administered 2020-02-22: 100 mL via INTRAVENOUS

## 2020-02-22 MED ORDER — SODIUM CHLORIDE 0.9 % IV BOLUS
1000.0000 mL | Freq: Once | INTRAVENOUS | Status: AC
  Administered 2020-02-22: 1000 mL via INTRAVENOUS

## 2020-02-22 MED ORDER — CEFTRIAXONE SODIUM 500 MG IJ SOLR
500.0000 mg | Freq: Once | INTRAMUSCULAR | Status: AC
  Administered 2020-02-22: 500 mg via INTRAMUSCULAR
  Filled 2020-02-22: qty 500

## 2020-02-22 MED ORDER — OXYCODONE-ACETAMINOPHEN 5-325 MG PO TABS
2.0000 | ORAL_TABLET | Freq: Once | ORAL | Status: AC
  Administered 2020-02-22: 2 via ORAL
  Filled 2020-02-22: qty 2

## 2020-02-22 MED ORDER — METRONIDAZOLE 500 MG PO TABS
2000.0000 mg | ORAL_TABLET | Freq: Once | ORAL | Status: AC
  Administered 2020-02-22: 2000 mg via ORAL
  Filled 2020-02-22: qty 4

## 2020-02-22 NOTE — ED Notes (Signed)
Patient transported to CT 
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
## 2020-02-22 NOTE — ED Notes (Signed)
SANE RN at bedside.
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
## 2020-02-23 NOTE — SANE Note (Signed)
ON 02/23/2020, AT APPROXIMATELY 1755 HOURS, I CALLED THE TELEPHONE NUMBER THAT WAS REFERENCED IN THE PT'S DEMOGRAPHICS [phone]).  A FEMALE ANSWERED THE PHONE AND SAID "BUENO," AND THEN HUNG UP THE PHONE.  I ALSO ATTEMPTED TO CALL [phone], AND AT FIRST GOT A BUSY SIGNAL AND THEN WHEN I TRIED TO CALL AGAIN, I GOT AN AUTOMATED MESSAGE SAYING THAT THE NUMBER WAS NOT WORKING AT THIS TIME.  THE OTHER TELEPHONE NUMBER [phone]) IS ALSO NOT A WORKING NUMBER.  I AM UNABLE TO REACH THE PT AT THIS TIME.

## 2020-02-23 NOTE — SANE Note (Signed)
At approximately 2230, this FNE realized that although verbal consent for examination had been provided by patient, written consent had not been obtained.  The patient had already left the hospital at this time.  Verbal consent was documented by this FNE and Elie Goody, RN, FNE.  Patient does not have a phone but attempts will be made to contact patient to obtain written consent.
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
## 2020-02-23 NOTE — SANE Note (Signed)
Prior to removing patient from the emergency department, FNE asked patient if how she was getting home after she was discharged.  Patient initially requested that she be allowed to spend the night in the hospital.  FNE explained that if she needed assistance getting to a safe place that the care management team could assist her.   After speaking with charge nurse, Shanda Bumps, it was determined that patient would be returned to the emergency department upon completion of her examination.  FNE returned to ER with patient and Burna Mortimer of care management was able to arrange transportation for patient.

## 2022-07-17 ENCOUNTER — Emergency Department: Payer: 59

## 2022-07-17 ENCOUNTER — Other Ambulatory Visit: Payer: Self-pay

## 2022-07-17 ENCOUNTER — Emergency Department
Admission: EM | Admit: 2022-07-17 | Discharge: 2022-07-17 | Disposition: A | Discharge status: Home / Self Care | Payer: 59 | Attending: Emergency Medicine | Admitting: Emergency Medicine

## 2022-07-17 DIAGNOSIS — R319 Hematuria, unspecified: Secondary | ICD-10-CM | POA: Insufficient documentation

## 2022-07-17 DIAGNOSIS — R601 Generalized edema: Secondary | ICD-10-CM

## 2022-07-17 DIAGNOSIS — N390 Urinary tract infection, site not specified: Secondary | ICD-10-CM | POA: Diagnosis not present

## 2022-07-17 DIAGNOSIS — R600 Localized edema: Secondary | ICD-10-CM | POA: Insufficient documentation

## 2022-07-17 LAB — BASIC METABOLIC PANEL
Anion gap: 8 (ref 5–15)
BUN: 11 mg/dL (ref 6–20)
CO2: 25 mmol/L (ref 22–32)
Calcium: 8.8 mg/dL — ABNORMAL LOW (ref 8.9–10.3)
Chloride: 105 mmol/L (ref 98–111)
Creatinine, Ser: 0.76 mg/dL (ref 0.44–1.00)
GFR, Estimated: >60 mL/min (ref >60)
Glucose, Bld: 91 mg/dL (ref 70–99)
Potassium: 3.8 mmol/L (ref 3.5–5.1)
Sodium: 138 mmol/L (ref 135–145)

## 2022-07-17 LAB — URINALYSIS, W/ REFLEX TO CULTURE (INFECTION SUSPECTED)
Bilirubin Urine: NEGATIVE
Glucose, UA: NEGATIVE mg/dL
Ketones, ur: NEGATIVE mg/dL
Nitrite: NEGATIVE
Protein, ur: NEGATIVE mg/dL
Specific Gravity, Urine: 1.01 (ref 1.005–1.030)
pH: 5 (ref 5.0–8.0)

## 2022-07-17 LAB — BRAIN NATRIURETIC PEPTIDE: B Natriuretic Peptide: 133.3 pg/mL — ABNORMAL HIGH (ref 0.0–100.0)

## 2022-07-17 LAB — MAGNESIUM: Magnesium: 2 mg/dL (ref 1.7–2.4)

## 2022-07-17 LAB — HEPATIC FUNCTION PANEL
ALT: 29 U/L (ref 0–44)
AST: 34 U/L (ref 15–41)
Albumin: 3.8 g/dL (ref 3.5–5.0)
Alkaline Phosphatase: 80 U/L (ref 38–126)
Bilirubin, Direct: 0.1 mg/dL (ref 0.0–0.2)
Indirect Bilirubin: 0.5 mg/dL (ref 0.3–0.9)
Total Bilirubin: 0.6 mg/dL (ref 0.3–1.2)
Total Protein: 7.3 g/dL (ref 6.5–8.1)

## 2022-07-17 LAB — CBC
HCT: 41.8 % (ref 36.0–46.0)
Hemoglobin: 13.6 g/dL (ref 12.0–15.0)
MCH: 25.1 pg — ABNORMAL LOW (ref 26.0–34.0)
MCHC: 32.5 g/dL (ref 30.0–36.0)
MCV: 77.3 fL — ABNORMAL LOW (ref 80.0–100.0)
Platelets: 393 10*3/uL (ref 150–400)
RBC: 5.41 MIL/uL — ABNORMAL HIGH (ref 3.87–5.11)
RDW: 13.2 % (ref 11.5–15.5)
WBC: 9.2 10*3/uL (ref 4.0–10.5)
nRBC: 0 % (ref 0.0–0.2)

## 2022-07-17 LAB — POC URINE PREG, ED: Preg Test, Ur: NEGATIVE

## 2022-07-17 MED ORDER — IBUPROFEN 600 MG PO TABS
600.0000 mg | ORAL_TABLET | Freq: Once | ORAL | Status: AC
  Administered 2022-07-17: 600 mg via ORAL
  Filled 2022-07-17: qty 1

## 2022-07-17 MED ORDER — FUROSEMIDE 40 MG PO TABS
20.0000 mg | ORAL_TABLET | Freq: Once | ORAL | Status: AC
  Administered 2022-07-17: 20 mg via ORAL
  Filled 2022-07-17: qty 1

## 2022-07-17 MED ORDER — CEFDINIR 300 MG PO CAPS
300.0000 mg | ORAL_CAPSULE | Freq: Once | ORAL | Status: AC
  Administered 2022-07-17: 300 mg via ORAL
  Filled 2022-07-17: qty 1

## 2022-07-17 MED ORDER — FUROSEMIDE 20 MG PO TABS: 20.0000 mg | ORAL_TABLET | Freq: Every day | ORAL | 0 refills | Status: AC

## 2022-07-17 MED ORDER — SODIUM CHLORIDE 0.9 % IV BOLUS
1000.0000 mL | Freq: Once | INTRAVENOUS | Status: AC
  Administered 2022-07-17: 1000 mL via INTRAVENOUS

## 2022-07-17 MED ORDER — CEFDINIR 300 MG PO CAPS: 300.0000 mg | ORAL_CAPSULE | Freq: Two times a day (BID) | ORAL | 0 refills | Status: AC

## 2022-07-17 NOTE — ED Triage Notes (Signed)
First RN note. Pt BIB [facility] from home. Pt reporting edema to arms and legs with bilateral flank pain. Pt reporting chest pressure with EMS as well. Pt reporting headache as well, but majority of pain is to the flank pain x3 days. Anuria x3 days

## 2022-07-17 NOTE — ED Provider Notes (Signed)
[facility] Provider Note   Event Date/Time   First MD Initiated Contact with Patient 07/17/22 1734     (approximate) History  multiple medical complaints  HPI Megan Shaffer is a 52 y.o. female with a past medical history of alcohol abuse, polysubstance abuse, and bipolar disorder who presents complaining of generalized edema as well as bilateral flank pain.  Patient also reported chest pressure with EMS.  Patient also now complaining of headache and generalized back pain.  Patient also states that she has not urinated in the last 3 days.  Patient is concerned as she states that she is normally on Lasix and has not been on it for the last several years. ROS: Patient currently denies any vision changes, tinnitus, difficulty speaking, facial droop, sore throat, abdominal pain, nausea/vomiting/diarrhea, or weakness/numbness/paresthesias in any extremity   Physical Exam  Triage Vital Signs: ED Triage Vitals  Enc Vitals Group     BP 07/17/22 1340 (!) 145/92     Pulse Rate 07/17/22 1340 84     Resp 07/17/22 1340 16     Temp 07/17/22 1340 98.4 F (36.9 C)     Temp Source 07/17/22 1340 Oral     SpO2 07/17/22 1340 94 %     Weight 07/17/22 1345 203 lb (92.1 kg)     Height 07/17/22 1345  (1.753 m)     Head Circumference --      Peak Flow --      Pain Score 07/17/22 1345 8     Pain Loc --      Pain Edu? --      Excl. in GC? --    Most recent vital signs: Vitals:   07/17/22 2130 07/17/22 2300  BP: (!) 113/53   Pulse: 63   Resp: 16   Temp:  98.3 F (36.8 C)  SpO2: 98% 98%   General: Awake, oriented x4. CV:  Good peripheral perfusion.  Resp:  Normal effort.  Abd:  No distention.  Other:  Middle-aged obese Caucasian female laying in bed in no acute distress ED Results / Procedures / Treatments  Labs (all labs ordered are listed, but only abnormal results are displayed) Labs Reviewed  BASIC METABOLIC PANEL - Abnormal; Notable for the following  components:      Result Value   Calcium 8.8 (*)    All other components within normal limits  BRAIN NATRIURETIC PEPTIDE - Abnormal; Notable for the following components:   B Natriuretic Peptide 133.3 (*)    All other components within normal limits  CBC - Abnormal; Notable for the following components:   RBC 5.41 (*)    MCV 77.3 (*)    MCH 25.1 (*)    All other components within normal limits  URINALYSIS, W/ REFLEX TO CULTURE (INFECTION SUSPECTED) - Abnormal; Notable for the following components:   Color, Urine YELLOW (*)    APPearance HAZY (*)    Hgb urine dipstick MODERATE (*)    Leukocytes,Ua TRACE (*)    Bacteria, UA RARE (*)    All other components within normal limits  MAGNESIUM  HEPATIC FUNCTION PANEL  POC URINE PREG, ED   EKG ED ECG REPORT I, Merwyn Katos, the attending physician, personally viewed and interpreted this ECG. Date: 07/17/2022 EKG Time: 1337 Rate: 83 Rhythm: normal sinus rhythm QRS Axis: normal Intervals: normal ST/T Wave abnormalities: normal Narrative Interpretation: no evidence of acute ischemia RADIOLOGY ED MD interpretation: One-view portable chest x-ray interpreted by me  shows no evidence of acute abnormalities including no pneumonia, pneumothorax, or widened mediastinum -Agree with radiology assessment Official radiology report(s): DG Chest Portable 1 View  Result Date: 07/17/2022 CLINICAL DATA:  Edema in arms and legs, BILATERAL flank pain, chest pressure, headache EXAM: PORTABLE CHEST 1 VIEW COMPARISON:  Portable exam 1422 hours compared to 04/23/2019 FINDINGS: Normal heart size, mediastinal contours, and pulmonary vascularity. Mild chronic subsegmental atelectasis at lingula. Lungs otherwise clear. No infiltrate, pleural effusion, or pneumothorax. Osseous structures unremarkable. IMPRESSION: No acute abnormalities. Minimal chronic atelectasis LEFT base. Electronically Signed   By: Ulyses Southward M.D.   On: 07/17/2022 14:34    PROCEDURES: Critical Care performed: No Procedures MEDICATIONS ORDERED IN ED: Medications  sodium chloride 0.9 % bolus 1,000 mL (0 mLs Intravenous Stopped 07/17/22 2230)  cefdinir (OMNICEF) capsule 300 mg (300 mg Oral Given 07/17/22 2256)  ibuprofen (ADVIL) tablet 600 mg (600 mg Oral Given 07/17/22 2256)  furosemide (LASIX) tablet 20 mg (20 mg Oral Given 07/17/22 2256)   IMPRESSION / MDM / ASSESSMENT AND PLAN / ED COURSE  I reviewed the triage vital signs and the nursing notes.                             The patient is on the cardiac monitor to evaluate for evidence of arrhythmia and/or significant heart rate changes. Patient's presentation is most consistent with acute presentation with potential threat to life or bodily function. Not Pregnant. Unlikely TOA, Ovarian Torsion, PID, gonorrhea/chlamydia. Low suspicion for Infected Urolithiasis, AAA, Cholecystitis, Pancreatitis, SBO, Appendicitis, or other acute abdomen.  Rx: Cefdinir 300 mg BID for 5 days Disposition: Discharge home. SRP discussed. Advise follow up with primary care provider within 24-72 hours.   FINAL CLINICAL IMPRESSION(S) / ED DIAGNOSES   Final diagnoses:  Mild generalized edema  Urinary tract infection with hematuria, site unspecified   Rx / DC Orders   ED Discharge Orders          Ordered    cefdinir (OMNICEF) 300 MG capsule  2 times daily        07/17/22 2247    furosemide (LASIX) 20 MG tablet  Daily        07/17/22 2247    Ambulatory Referral to Primary Care (Establish Care)        07/17/22 2248           Note:  This document was prepared using Dragon voice recognition software and may include unintentional dictation errors.   Merwyn Katos, MD 07/17/22 [phone]

## 2022-07-17 NOTE — ED Triage Notes (Signed)
Pt states that she has been off her lasix for several years, states that she has started swelling again recently

## 2022-09-10 ENCOUNTER — Emergency Department
Admission: EM | Admit: 2022-09-10 | Discharge: 2022-09-11 | Disposition: A | Discharge status: Home / Self Care | Payer: 59 | Attending: Emergency Medicine | Admitting: Emergency Medicine

## 2022-09-10 DIAGNOSIS — R519 Headache, unspecified: Secondary | ICD-10-CM | POA: Diagnosis present

## 2022-09-10 DIAGNOSIS — G43009 Migraine without aura, not intractable, without status migrainosus: Secondary | ICD-10-CM | POA: Insufficient documentation

## 2022-09-10 MED ORDER — PREDNISONE 50 MG PO TABS: 50.0000 mg | ORAL_TABLET | Freq: Every day | ORAL | 0 refills | Status: AC

## 2022-09-10 MED ORDER — DIPHENHYDRAMINE HCL 50 MG/ML IJ SOLN
25.0000 mg | Freq: Once | INTRAMUSCULAR | Status: AC
  Administered 2022-09-10: 25 mg via INTRAMUSCULAR

## 2022-09-10 MED ORDER — ACETAMINOPHEN 500 MG PO TABS
1000.0000 mg | ORAL_TABLET | Freq: Once | ORAL | Status: AC
  Administered 2022-09-10: 1000 mg via ORAL
  Filled 2022-09-10: qty 2

## 2022-09-10 MED ORDER — DIPHENHYDRAMINE HCL 50 MG/ML IJ SOLN
25.0000 mg | Freq: Once | INTRAMUSCULAR | Status: DC
  Filled 2022-09-10: qty 1

## 2022-09-10 MED ORDER — PROCHLORPERAZINE EDISYLATE 10 MG/2ML IJ SOLN
10.0000 mg | Freq: Once | INTRAMUSCULAR | Status: AC
  Administered 2022-09-10: 10 mg via INTRAMUSCULAR

## 2022-09-10 MED ORDER — PREDNISONE 50 MG PO TABS: 50.0000 mg | ORAL_TABLET | Freq: Every day | ORAL | 0 refills | Status: DC

## 2022-09-10 MED ORDER — SODIUM CHLORIDE 0.9 % IV SOLN: 1000.0000 mL | INTRAVENOUS | Status: DC

## 2022-09-10 MED ORDER — PROCHLORPERAZINE EDISYLATE 10 MG/2ML IJ SOLN
10.0000 mg | Freq: Once | INTRAMUSCULAR | Status: DC
  Filled 2022-09-10: qty 2

## 2022-09-10 MED ORDER — DEXAMETHASONE SODIUM PHOSPHATE 10 MG/ML IJ SOLN
10.0000 mg | Freq: Once | INTRAMUSCULAR | Status: AC
  Administered 2022-09-10: 10 mg via INTRAMUSCULAR
  Filled 2022-09-10: qty 1

## 2022-09-10 MED ORDER — SODIUM CHLORIDE 0.9 % IV BOLUS (SEPSIS): 1000.0000 mL | Freq: Once | INTRAVENOUS | Status: DC

## 2022-09-10 MED ORDER — ACETAMINOPHEN 500 MG PO TABS: 1000.0000 mg | ORAL_TABLET | Freq: Once | ORAL | Status: DC

## 2022-09-10 MED ORDER — ACETAMINOPHEN 500 MG PO TABS: 1000.0000 mg | ORAL_TABLET | Freq: Four times a day (QID) | ORAL | 2 refills | Status: DC | PRN

## 2022-09-10 NOTE — ED Provider Notes (Signed)
[facility] Emergency Department Provider Note     Event Date/Time   First MD Initiated Contact with Patient 09/10/22 1928     (approximate)   History   Headache   HPI  Megan Shaffer is a 52 y.o. female with a history of seizures and substance use presents to the ED for evaluation and treatment of a severe headache with onset of 3 days that has progressively worsened.  Associated symptoms include nausea, and light sensitivity.  She reports history of migraines and last episode was 2 years ago.     Physical Exam   Triage Vital Signs: ED Triage Vitals [09/10/22 1857]  Enc Vitals Group     BP 105/74     Pulse Rate 100     Resp 19     Temp 98.1 F (36.7 C)     Temp Source Oral     SpO2 99 %     Weight 200 lb (90.7 kg)     Height 5\' 9"  (1.753 m)     Head Circumference      Peak Flow      Pain Score 8     Pain Loc      Pain Edu?      Excl. in GC?     Most recent vital signs: Vitals:   09/10/22 1857  BP: 105/74  Pulse: 100  Resp: 19  Temp: 98.1 F (36.7 C)  SpO2: 99%    General Awake, no distress. *** {**HEENT NCAT. PERRL. EOMI. No rhinorrhea. Mucous membranes are moist. **} CV:  Good peripheral perfusion. *** RESP:  Normal effort. *** ABD:  No distention. *** {**Other: **}   ED Results / Procedures / Treatments   Labs (all labs ordered are listed, but only abnormal results are displayed) Labs Reviewed - No data to display  EKG  ***  RADIOLOGY  {**I personally viewed and evaluated these images as part of my medical decision making, as well as reviewing the written report by the radiologist.  ED Provider Interpretation: ***  History and physical examination do not warrant a lab work up or imaging at this time. ***  No results found.   PROCEDURES:  Critical Care performed: {CriticalCareYesNo:19197::"Yes, see critical care procedure note(s)","No"}  Procedures   MEDICATIONS ORDERED IN ED: Medications  sodium  chloride 0.9 % bolus 1,000 mL (has no administration in time range)    Followed by  0.9 %  sodium chloride infusion (has no administration in time range)  acetaminophen (TYLENOL) tablet 1,000 mg (has no administration in time range)  prochlorperazine (COMPAZINE) injection 10 mg (has no administration in time range)  diphenhydrAMINE (BENADRYL) injection 25 mg (has no administration in time range)     IMPRESSION / MDM / ASSESSMENT AND PLAN / ED COURSE  I reviewed the triage vital signs and the nursing notes.                                 52 y.o. female presents to the emergency department for evaluation and treatment of ***. See HPI for further details. Vital signs and physical exam are pertinent for ***.   Differential diagnosis includes, but is not limited to ***   {**The patient is on the cardiac monitor to evaluate for evidence of arrhythmia and/or significant heart rate changes.**}  Lab work ordered and reviewed revealing ***   Imaging ordered and reviewed ***  The patient was administered migraine cocktail including Tylenol, Compazine, and Benadryl resulting in *** of symptoms.  Patient is in satisfactory and stable condition for discharge and outpatient follow up. Patient will be discharged home with prescriptions for ***. Patient is to follow up with *** as needed or otherwise directed. Patient is given ED precautions to return to the ED for any worsening or new symptoms. Patient verbalizes understanding. All questions and concerns were addressed during ED visit.    Patient's presentation is most consistent with {EM COPA:27473}  FINAL CLINICAL IMPRESSION(S) / ED DIAGNOSES   Final diagnoses:  None     Rx / DC Orders   ED Discharge Orders     None        Note:  This document was prepared using Dragon voice recognition software and may include unintentional dictation errors.

## 2022-09-10 NOTE — ED Triage Notes (Signed)
Pt sts that she has been having a headache for the last three days. Pt sts that she has been taking tylenol but nothing else helps. Pt also sts that she pulled a tick off of her foot five days ago.
